# Patient Record
Sex: Female | Born: 1990 | Race: White | Hispanic: No | Marital: Single | Smoking: Former smoker
Health system: Southern US, Community
[De-identification: ages and names within clinical notes are randomized; demographics above are authoritative.]

## PROBLEM LIST (undated history)

## (undated) DIAGNOSIS — E282 Polycystic ovarian syndrome: Secondary | ICD-10-CM

## (undated) DIAGNOSIS — F32A Depression, unspecified: Secondary | ICD-10-CM

## (undated) DIAGNOSIS — T7840XA Allergy, unspecified, initial encounter: Secondary | ICD-10-CM

## (undated) DIAGNOSIS — F329 Major depressive disorder, single episode, unspecified: Secondary | ICD-10-CM

## (undated) DIAGNOSIS — F17200 Nicotine dependence, unspecified, uncomplicated: Secondary | ICD-10-CM

## (undated) HISTORY — DX: Polycystic ovarian syndrome: E28.2

## (undated) HISTORY — DX: Depression, unspecified: F32.A

## (undated) HISTORY — PX: WISDOM TOOTH EXTRACTION: SHX21

## (undated) HISTORY — DX: Allergy, unspecified, initial encounter: T78.40XA

## (undated) HISTORY — DX: Nicotine dependence, unspecified, uncomplicated: F17.200

## (undated) HISTORY — DX: Major depressive disorder, single episode, unspecified: F32.9

---
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
## 2009-07-13 ENCOUNTER — Emergency Department (HOSPITAL_COMMUNITY): Admission: EM | Admit: 2009-07-13 | Discharge: 2009-07-13 | Payer: Self-pay | Admitting: Emergency Medicine

## 2009-07-18 ENCOUNTER — Emergency Department (HOSPITAL_COMMUNITY): Admission: EM | Admit: 2009-07-18 | Discharge: 2009-07-18 | Payer: Self-pay | Admitting: Family Medicine

## 2009-08-25 ENCOUNTER — Emergency Department (HOSPITAL_COMMUNITY): Admission: EM | Admit: 2009-08-25 | Discharge: 2009-08-25 | Payer: Self-pay | Admitting: Emergency Medicine

## 2009-08-31 ENCOUNTER — Ambulatory Visit: Payer: Self-pay | Admitting: Internal Medicine

## 2009-08-31 DIAGNOSIS — S93409A Sprain of unspecified ligament of unspecified ankle, initial encounter: Secondary | ICD-10-CM | POA: Insufficient documentation

## 2010-02-05 ENCOUNTER — Emergency Department (HOSPITAL_COMMUNITY): Admission: EM | Admit: 2010-02-05 | Discharge: 2010-02-05 | Payer: Self-pay | Admitting: Emergency Medicine

## 2010-03-04 ENCOUNTER — Ambulatory Visit: Payer: Self-pay | Admitting: Family Medicine

## 2010-05-07 NOTE — Assessment & Plan Note (Signed)
Summary: SICK/JBB   Vital Signs:  Patient Profile:   20 Years Old Female CC:      Cold & URI symptoms Height:     69 inches Weight:      228 pounds BMI:     33.79 O2 Sat:      97 % O2 treatment:    Room Air Temp:     97.1 degrees F oral Pulse rate:   54 / minute Pulse rhythm:   regular Resp:     20 per minute BP sitting:   136 / 71  (right arm)  Pt. in pain?   yes    Location:   head    Intensity:   5    Type:       aching  Vitals Entered By: Levonne Spiller EMT-P (March 04, 2010 2:03 PM)              Is Patient Diabetic? No  Does patient need assistance? Functional Status Self care Ambulation Normal Comments Pt. is a smoker. Half pack per day.      Current Allergies: No known allergies History of Present Illness History from: patient Chief Complaint: Cold & URI symptoms History of Present Illness: The patient is presenting today to be evaluated for sinus pain and pressure and headache.  She is having significant postnasal drainage and congestion and low grade fever.  Pt says she has been having symptoms for at least 1.5 weeks and maybe more than 2.  She has been around sick contacts at her job and all of them have gotten better and she has continued to be sick and she is upset about that.  In addition, she says that she is starting to have sore throat. She denies nausea and vomiting and denies being pregnant.    REVIEW OF SYSTEMS Constitutional Symptoms      Denies fever, chills, night sweats, weight loss, weight gain, and fatigue.  Eyes       Denies change in vision, eye pain, eye discharge, glasses, contact lenses, and eye surgery. Ear/Nose/Throat/Mouth       Complains of ear pain, frequent runny nose, sinus problems, and sore throat.      Denies hearing loss/aids, change in hearing, ear discharge, dizziness, frequent nose bleeds, hoarseness, and tooth pain or bleeding.      Comments: Bilateral Ear Pain Respiratory       Complains of productive cough.       Denies dry cough, wheezing, shortness of breath, asthma, bronchitis, and emphysema/COPD.      Comments: Colored Sputum Cardiovascular       Denies murmurs, chest pain, and tires easily with exhertion.    Gastrointestinal       Denies stomach pain, nausea/vomiting, diarrhea, constipation, blood in bowel movements, and indigestion. Genitourniary       Denies painful urination, kidney stones, and loss of urinary control. Neurological       Denies paralysis, seizures, and fainting/blackouts. Musculoskeletal       Denies muscle pain, joint pain, joint stiffness, decreased range of motion, redness, swelling, muscle weakness, and gout.  Skin       Denies bruising, unusual mles/lumps or sores, and hair/skin or nail changes.  Psych       Denies mood changes, temper/anger issues, anxiety/stress, speech problems, depression, and sleep problems.  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 Smokes since senior year of [employer] No alcohol or drugs  Risk Factors: Caffeine Use: 3 (01/20/2007)  Risk Factors: Smoking Status: current (03/04/2010) Packs/Day: 1.0 (03/04/2010) Cans of tobacco/wk: no (01/20/2007)  Past Medical History: Depressive Disorder Allergic Rhinitis Chronic Active Nicotine Dependence  Past Surgical History: Denies surgical history  Family History: Brother and Dad have bipolar disorder depression dad has Parkinson's disease  Social History: Mom lives in [place]  - Father lives iin [place]  GED 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 Smokes since senior year of [employer] No alcohol or drugs  Packs/Day:  1.0 Physical Exam General appearance: well developed, well nourished, no acute  distress Head: normocephalic, atraumatic Eyes: conjunctivae and lids normal Pupils: equal, round, reactive to light Ears: normal, no lesions or deformities Nasal: pale, boggy, swollen nasal turbinates with thick yellow discharge seen Oral/Pharynx: tongue normal, posterior pharynx with erythema, no exudate, tonsillar enlargement and touching seen Neck: neck supple,  trachea midline, no masses Thyroid: no nodules, masses, tenderness, or enlargement Chest/Lungs: no rales, wheezes, or rhonchi bilateral, breath sounds equal without effort Heart: regular rate and  rhythm, no murmur Abdomen: soft, non-tender without obvious organomegaly Extremities: normal extremities Neurological: grossly intact and non-focal Skin: no obvious rashes or lesions MSE: oriented to time, place, and person Assessment Problems:   SPRAIN/STRAIN, ANKLE NOS (ICD-845.00) CHEST PAIN (ICD-786.50) PANIC ATTACK, ACUTE (ICD-300.01) NAUSEA (ICD-787.02) TONSILLITIS, ACUTE (ICD-463) SYNCOPE (ICD-780.2) TOBACCO ABUSE (ICD-305.1) IRREGULAR MENSES (ICD-626.4) SYNCOPE, HX OF (ICD-V12.49) DEPRESSION, MAJOR (ICD-296.20)  Assessed TOBACCO ABUSE as unchanged - Clanford Johnson MD Assessed CHEST PAIN as improved - Standley Dakins MD Assessed PANIC ATTACK, ACUTE as improved - Standley Dakins MD New Problems: ACUTE SINUSITIS, UNSPECIFIED (ICD-461.9)   Patient Education: Patient and/or caregiver instructed in the following: rest, fluids. The risks, benefits and possible side effects were clearly explained and discussed with the patient.  The patient verbalized clear understanding.  The patient was given instructions to return if symptoms don't improve, worsen or new changes develop.  If it is not during clinic hours and the patient cannot get back to this clinic then the patient was told to seek medical care at an available urgent care or emergency department.  The patient verbalized understanding.   Demonstrates willingness to  comply.  Plan New Medications/Changes: AMOXICILLIN 875 MG TABS (AMOXICILLIN) take 1 by mouth two times a day until completed  #20 x 0, 03/04/2010, Clanford Johnson MD FLUTICASONE PROPIONATE 50 MCG/ACT SUSP (FLUTICASONE PROPIONATE) 2 sprays per nostril once daily  #1 x 0, 03/04/2010, Clanford Johnson MD  New Orders: Tobacco use cessation intensive >10 minutes [99407] Planning Comments:   The patient was counseled and advised to stop using all tobacco products.  Medical assistance was offered and the patient was encouraged to call 1-800-QUIT-NOW to get a smoking cessation coach.   More than 12 mins of counseling performed.  Follow Up: Follow up in 1-2 days if no improvement, Follow up on an as needed basis, Follow up with Primary Physician  The patient and/or caregiver has been counseled thoroughly with regard to medications prescribed including dosage, schedule, interactions, rationale for use, and possible side effects and they verbalize understanding.  Diagnoses and expected course of recovery discussed and will return if not improved as expected or if the condition worsens. Patient and/or caregiver verbalized understanding.  Prescriptions: AMOXICILLIN 875 MG TABS (AMOXICILLIN) take 1 by  mouth two times a day until completed  #20 x 0   Entered and Authorized by:   Standley Dakins MD   Signed by:   Standley Dakins MD on 03/04/2010   Method used:   Electronically to        [facility] [id number] (retail)       [address]       [place], [place]  [address]       Ph: [phone] or [phone]       Fax: [phone]   RxID:   [id number] FLUTICASONE PROPIONATE 50 MCG/ACT SUSP (FLUTICASONE PROPIONATE) 2 sprays per nostril once daily  #1 x 0   Entered and Authorized by:   Standley Dakins MD   Signed by:   Standley Dakins MD on 03/04/2010   Method used:   [facility]        [facility] (retail)       [address]       [place], [place]   [address]       Ph: [phone] or [phone]       Fax: [phone]   RxID:   [id number]   Patient Instructions: 1)  Tobacco is very bad for your health and your loved ones! You Should stop smoking!. 2)  Stop Smoking Tips: Choose a Quit date. Cut down before the Quit date. decide what you will do as a substitute when you feel the urge to smoke(gum,toothpick,exercise). 3)  Oral Rehydration Solution: drink 1/2 ounce every 15 minutes. If tolerated afert 1 hour, drink 1 ounce every 15 minutes. As you can tolerate, keep adding 1/2 ounce every 15 minutes, up to a total of 2-4 ounces. Contact the office if unable to tolerate oral solution, if you keep vomiting, or you continue to have signs of dehydration. 4)  Take your antibiotic as prescribed until ALL of it is gone, but stop if you develop a rash or swelling and contact our office as soon as possible. 5)  Acute sinusitis symptoms for less than 10 days are not helped by antibiotics.Use warm moist compresses, and over the counter decongestants ( only as directed). Call if no improvement in 5-7 days, sooner if increasing pain, fever, or new symptoms. 6)  The patient was informed that there is no on-call provider or services available at this clinic during off-hours (when the clinic is closed).  If the patient developed a problem or concern that required immediate attention, the patient was advised to go the the nearest available urgent care or emergency department for medical care.  The patient verbalized understanding.   7)  The patient was counseled and advised to stop using all tobacco products.  Medical assistance was offered and the patient was encouraged to call 1-800-QUIT-NOW to get a smoking cessation coach.     Orders Added: 1)  Tobacco use cessation intensive >10 minutes [99407]    Preventive Screening-Counseling & Management  Alcohol-Tobacco     Smoking Status: current     Smoking Cessation Counseling: yes     Smoke Cessation Stage:  precontemplative     Packs/Day: 1.0     Year Started: 2008     Tobacco Counseling: to quit use of tobacco products
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
## 2010-05-12 ENCOUNTER — Inpatient Hospital Stay (INDEPENDENT_AMBULATORY_CARE_PROVIDER_SITE_OTHER)
Admission: RE | Admit: 2010-05-12 | Discharge: 2010-05-12 | Disposition: A | Discharge status: Home / Self Care | Payer: BC Managed Care – PPO | Source: Ambulatory Visit | Attending: Family Medicine | Admitting: Family Medicine

## 2010-05-12 ENCOUNTER — Ambulatory Visit (HOSPITAL_COMMUNITY)
Admission: RE | Admit: 2010-05-12 | Discharge: 2010-05-12 | Disposition: A | Discharge status: Home / Self Care | Payer: BC Managed Care – PPO | Source: Ambulatory Visit | Attending: Family Medicine | Admitting: Family Medicine

## 2010-05-12 DIAGNOSIS — R05 Cough: Secondary | ICD-10-CM | POA: Insufficient documentation

## 2010-05-12 DIAGNOSIS — R059 Cough, unspecified: Secondary | ICD-10-CM | POA: Insufficient documentation

## 2010-05-12 DIAGNOSIS — R509 Fever, unspecified: Secondary | ICD-10-CM | POA: Insufficient documentation

## 2010-05-12 DIAGNOSIS — B9789 Other viral agents as the cause of diseases classified elsewhere: Secondary | ICD-10-CM

## 2010-05-12 LAB — POCT URINALYSIS DIPSTICK
Bilirubin Urine: NEGATIVE
Nitrite: NEGATIVE
Urine Glucose, Fasting: NEGATIVE mg/dL
Urobilinogen, UA: 0.2 mg/dL (ref 0.0–1.0)
pH: 6 (ref 5.0–8.0)

## 2010-05-12 LAB — POCT I-STAT, CHEM 8
Glucose, Bld: 98 mg/dL (ref 70–99)
HCT: 45 % (ref 36.0–46.0)
Hemoglobin: 15.3 g/dL — ABNORMAL HIGH (ref 12.0–15.0)
Potassium: 3.8 mEq/L (ref 3.5–5.1)
Sodium: 137 mEq/L (ref 135–145)
TCO2: 23 mmol/L (ref 0–100)

## 2010-06-18 LAB — DIFFERENTIAL
Basophils Absolute: 0 10*3/uL (ref 0.0–0.1)
Basophils Relative: 0 % (ref 0–1)
Lymphocytes Relative: 17 % (ref 12–46)
Monocytes Absolute: 0.6 10*3/uL (ref 0.1–1.0)
Neutro Abs: 9 10*3/uL — ABNORMAL HIGH (ref 1.7–7.7)
Neutrophils Relative %: 78 % — ABNORMAL HIGH (ref 43–77)

## 2010-06-18 LAB — BASIC METABOLIC PANEL
BUN: 8 mg/dL (ref 6–23)
Calcium: 9 mg/dL (ref 8.4–10.5)
GFR calc non Af Amer: >60 mL/min (ref >60)
Glucose, Bld: 86 mg/dL (ref 70–99)
Potassium: 4.1 mEq/L (ref 3.5–5.1)

## 2010-06-18 LAB — CBC
HCT: 44.5 % (ref 36.0–46.0)
MCHC: 34.5 g/dL (ref 30.0–36.0)
Platelets: 192 10*3/uL (ref 150–400)
RDW: 12.3 % (ref 11.5–15.5)
WBC: 11.7 10*3/uL — ABNORMAL HIGH (ref 4.0–10.5)

## 2010-06-18 LAB — POCT PREGNANCY, URINE: Preg Test, Ur: NEGATIVE

## 2011-03-23 ENCOUNTER — Emergency Department (HOSPITAL_COMMUNITY): Payer: BC Managed Care – PPO

## 2011-03-23 ENCOUNTER — Emergency Department (HOSPITAL_COMMUNITY)
Admission: EM | Admit: 2011-03-23 | Discharge: 2011-03-23 | Disposition: A | Discharge status: Home / Self Care | Payer: BC Managed Care – PPO | Attending: Emergency Medicine | Admitting: Emergency Medicine

## 2011-03-23 ENCOUNTER — Encounter: Payer: Self-pay | Admitting: *Deleted

## 2011-03-23 DIAGNOSIS — S99929A Unspecified injury of unspecified foot, initial encounter: Secondary | ICD-10-CM | POA: Insufficient documentation

## 2011-03-23 DIAGNOSIS — W1809XA Striking against other object with subsequent fall, initial encounter: Secondary | ICD-10-CM | POA: Insufficient documentation

## 2011-03-23 DIAGNOSIS — S8990XA Unspecified injury of unspecified lower leg, initial encounter: Secondary | ICD-10-CM | POA: Insufficient documentation

## 2011-03-23 DIAGNOSIS — S90819A Abrasion, unspecified foot, initial encounter: Secondary | ICD-10-CM

## 2011-03-23 DIAGNOSIS — IMO0002 Reserved for concepts with insufficient information to code with codable children: Secondary | ICD-10-CM | POA: Insufficient documentation

## 2011-03-23 MED ORDER — HYDROCODONE-ACETAMINOPHEN 5-325 MG PO TABS: 2.0000 | ORAL_TABLET | ORAL | Status: AC | PRN

## 2011-03-23 NOTE — ED Notes (Signed)
Patient transported to X-ray 

## 2011-03-23 NOTE — ED Provider Notes (Signed)
History     CSN: [id number] Arrival date & time: 03/23/2011 11:58 AM   First MD Initiated Contact with Patient 03/23/11 1247      Chief Complaint  Patient presents with  . Toe Injury    (Consider location/radiation/quality/duration/timing/severity/associated sxs/prior treatment) HPI Reports falling and injuring right toes on concrete, abrasions noted to 2nd and 3rd toes. Minimal bleeding noted. Ambulatory at triage.  History reviewed. No pertinent past medical history.  History reviewed. No pertinent past surgical history.  History reviewed. No pertinent family history.  History  Substance Use Topics  . Smoking status: Never Smoker   . Smokeless tobacco: Not on file  . Alcohol Use: No    OB History    Grav Para Term Preterm Abortions TAB SAB Ect Mult Living                  Review of Systems  All other systems reviewed and are negative.    Allergies  Review of patient's allergies indicates no known allergies.  Home Medications  No current outpatient prescriptions on file.  BP 122/93  Pulse 91  Temp(Src) 98 F (36.7 C) (Oral)  Resp 18  SpO2 97%  LMP 02/23/2011  Physical Exam  Nursing note and vitals reviewed. Constitutional: She is oriented to person, place, and time. She appears well-developed and well-nourished. No distress.  HENT:  Head: Normocephalic and atraumatic.  Eyes: Pupils are equal, round, and reactive to light.  Neck: Normal range of motion.  Cardiovascular: Normal rate and intact distal pulses.   Pulmonary/Chest: No respiratory distress.  Abdominal: Normal appearance. She exhibits no distension.  Musculoskeletal: Normal range of motion.       Feet:  Neurological: She is alert and oriented to person, place, and time. No cranial nerve deficit.  Skin: Skin is warm and dry. No rash noted.  Psychiatric: She has a normal mood and affect. Her behavior is normal.    ED Course  Procedures (including critical care time)  Labs Reviewed -  No data to display Dg Foot Complete Right  03/23/2011  *RADIOLOGY REPORT*  Clinical Data: Trauma.  The patient tripped on steps.  Abrasions of the top of the toes.  RIGHT FOOT COMPLETE - 3+ VIEW  Comparison: Right ankle 08/25/2009  Findings: There is no evidence for acute fracture or dislocation. No soft tissue foreign body or gas identified.  IMPRESSION: Negative  Original Report Authenticated By: Patterson Hammersmith, M.D.     1. Abrasion of foot or toe       MDM          Nelia Shi, MD 03/23/11 1325

## 2011-03-23 NOTE — ED Notes (Signed)
Patient states that she struck her toes on concrete steps and fell.  Pt has abrasions noted to her second, third and fourth toe of her right foot.  Bleeding controlled.  Pt has swelling to same.

## 2011-03-23 NOTE — ED Notes (Signed)
Reports falling and injuring right toes on concrete, abrasions noted to 2nd and 3rd toes. Minimal bleeding noted. Ambulatory at triage.

## 2011-06-25 ENCOUNTER — Ambulatory Visit (INDEPENDENT_AMBULATORY_CARE_PROVIDER_SITE_OTHER): Payer: BC Managed Care – PPO | Admitting: Family Medicine

## 2011-06-25 ENCOUNTER — Encounter: Payer: Self-pay | Admitting: Family Medicine

## 2011-06-25 VITALS — BP 120/78 | HR 107 | Temp 98.4°F | Ht 68.0 in | Wt 249.1 lb

## 2011-06-25 DIAGNOSIS — N926 Irregular menstruation, unspecified: Secondary | ICD-10-CM

## 2011-06-25 DIAGNOSIS — N912 Amenorrhea, unspecified: Secondary | ICD-10-CM

## 2011-06-25 DIAGNOSIS — M545 Low back pain: Secondary | ICD-10-CM

## 2011-06-25 LAB — POCT URINE PREGNANCY: Preg Test, Ur: NEGATIVE

## 2011-06-25 MED ORDER — TIZANIDINE HCL 4 MG PO TABS: 4.0000 mg | ORAL_TABLET | Freq: Every evening | ORAL | Status: DC

## 2011-06-25 MED ORDER — DICLOFENAC SODIUM 75 MG PO TBEC: 75.0000 mg | DELAYED_RELEASE_TABLET | Freq: Two times a day (BID) | ORAL | Status: DC

## 2011-06-25 MED ORDER — HYDROCODONE-ACETAMINOPHEN 5-500 MG PO TABS: 1.0000 | ORAL_TABLET | Freq: Four times a day (QID) | ORAL | Status: DC | PRN

## 2011-06-25 MED ORDER — OXYCODONE-ACETAMINOPHEN 5-325 MG PO TABS: 1.0000 | ORAL_TABLET | ORAL | Status: AC | PRN
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
## 2011-06-26 LAB — HCG, SERUM, QUALITATIVE: Preg, Serum: NEGATIVE

## 2011-06-27 ENCOUNTER — Telehealth: Payer: Self-pay | Admitting: Internal Medicine

## 2011-06-27 NOTE — Telephone Encounter (Signed)
Pt was seen 06/25/11 and she is saying the Muscle Relaxer Prescribed for her is giving her extreme dry mouth to where she can't sleep for waking up and having to chug water. She is wondering if anything else can be prescribed in place of that muscle relaxer.

## 2011-06-29 NOTE — Telephone Encounter (Signed)
D/c the zanaflex that i gave her  In stead: Flexeril 10 mg, 1/2 to 1 tab po qhs, 30, 0 refills

## 2011-06-30 MED ORDER — CYCLOBENZAPRINE HCL 10 MG PO TABS: 10.0000 mg | ORAL_TABLET | Freq: Every evening | ORAL | Status: AC | PRN

## 2011-06-30 NOTE — Telephone Encounter (Signed)
Patient advised and rx sent to the pharmacy  

## 2012-01-16 ENCOUNTER — Encounter (HOSPITAL_COMMUNITY): Payer: Self-pay | Admitting: Emergency Medicine

## 2012-01-16 ENCOUNTER — Emergency Department (INDEPENDENT_AMBULATORY_CARE_PROVIDER_SITE_OTHER)
Admission: EM | Admit: 2012-01-16 | Discharge: 2012-01-16 | Disposition: A | Discharge status: Home / Self Care | Payer: BC Managed Care – PPO | Source: Home / Self Care

## 2012-01-16 ENCOUNTER — Ambulatory Visit (HOSPITAL_COMMUNITY)
Admission: EM | Admit: 2012-01-16 | Discharge: 2012-01-16 | Disposition: A | Discharge status: Home / Self Care | Payer: BC Managed Care – PPO | Attending: Family Medicine | Admitting: Family Medicine

## 2012-01-16 DIAGNOSIS — S93409A Sprain of unspecified ligament of unspecified ankle, initial encounter: Secondary | ICD-10-CM

## 2012-01-16 DIAGNOSIS — X500XXA Overexertion from strenuous movement or load, initial encounter: Secondary | ICD-10-CM | POA: Insufficient documentation

## 2012-01-16 DIAGNOSIS — M25579 Pain in unspecified ankle and joints of unspecified foot: Secondary | ICD-10-CM | POA: Insufficient documentation

## 2012-01-16 NOTE — ED Provider Notes (Signed)
History     CSN: [id number]  Arrival date & time 01/16/12  1140   None     Chief Complaint  Patient presents with  . Foot Injury    (Consider location/radiation/quality/duration/timing/severity/associated sxs/prior treatment) HPI Comments: This 21 year old female stepped off a curb and twisted her left ankle this morning around 10 AM. She is complaining of pain over the lateral malleolus. Denies foot pain or tenderness, denies pain over the medial malleolus.  Patient is a 21 y.o. female presenting with foot injury.  Foot Injury     Past Medical History  Diagnosis Date  . Depression   . Allergy   . Nicotine addiction     History reviewed. No pertinent past surgical history.  Family History  Problem Relation Age of Onset  . Mental illness Father   . Parkinsonism Father   . Mental illness Brother     History  Substance Use Topics  . Smoking status: Former Games developer  . Smokeless tobacco: Not on file  . Alcohol Use: Yes    OB History    Grav Para Term Preterm Abortions TAB SAB Ect Mult Living                  Review of Systems  Constitutional: Negative for fever, chills and activity change.  HENT: Negative.   Respiratory: Negative.   Cardiovascular: Negative.   Musculoskeletal:       As per HPI  Skin: Negative for color change, pallor and rash.  Neurological: Negative.     Allergies  Review of patient's allergies indicates no known allergies.  Home Medications   Current Outpatient Rx  Name Route Sig Dispense Refill  . DICLOFENAC SODIUM 75 MG PO TBEC Oral Take 1 tablet (75 mg total) by mouth 2 (two) times daily. 60 tablet 3    BP 119/78  Pulse 70  Temp 98.8 F (37.1 C) (Oral)  Resp 18  SpO2 100%  LMP 01/11/2012  Physical Exam  Constitutional: She is oriented to person, place, and time. She appears well-developed and well-nourished. No distress.  HENT:  Head: Normocephalic and atraumatic.  Eyes: EOM are normal. Pupils are equal, round, and  reactive to light.  Neck: Normal range of motion. Neck supple.  Musculoskeletal: She exhibits edema and tenderness.       Swelling and tenderness over the lateral malleolus of the left ankle. No deformity, no swelling or tenderness of the foot. Pedal pulses 2+, neurovascular and motor sensory intact.  Lymphadenopathy:    She has no cervical adenopathy.  Neurological: She is alert and oriented to person, place, and time. No cranial nerve deficit.  Skin: Skin is warm and dry.  Psychiatric: She has a normal mood and affect.    ED Course  Procedures (including critical care time)  Labs Reviewed - No data to display Dg Ankle Complete Left  01/16/2012  *RADIOLOGY REPORT*  Clinical Data: Pain lateral malleolus after twisting.  LEFT ANKLE COMPLETE - 3+ VIEW  Comparison: None.  Findings: Soft tissue swelling lateral malleolar region.  No underlying fracture or dislocation.  IMPRESSION: Soft tissue swelling lateral malleolar region.  No underlying fracture or dislocation.   Original Report Authenticated By: Fuller Canada, M.D.      1. Ankle sprain       MDM  Rest, ice, elevation, wear ASO. She states she is unable to use crutches because she does not have the upper body strength. She is requesting a form of immobilization such as a boot  and she can wear without having to use crutches. He was advised that she not bear weight for the first 2 or 3 days minimal.         Hayden Rasmussen, NP 01/16/12 1403

## 2012-01-16 NOTE — ED Notes (Signed)
Pt is c/o left foot inj since 10:00am... Says she stepped of sidewalk when she twisted her left ankle... Says she heard a "popping" sound.... Sx include: tender, swelling, unable to put pressure... Pt is alert w/no signs of distress.

## 2012-01-17 NOTE — ED Provider Notes (Signed)
Medical screening examination/treatment/procedure(s) were performed by resident physician or non-physician practitioner and as supervising physician I was immediately available for consultation/collaboration.   KINDL,JAMES DOUGLAS MD.    James D Kindl, MD 01/17/12 1733 

## 2012-02-02 IMAGING — CR DG HAND COMPLETE 3+V*R*
3 series · 3 of 3 positions shown · non-contrast
Comparison: None.

CLINICAL DATA: Fell, smashing injury, pain

RIGHT HAND - COMPLETE 3+ VIEW

[view not recorded (1 of 3)]
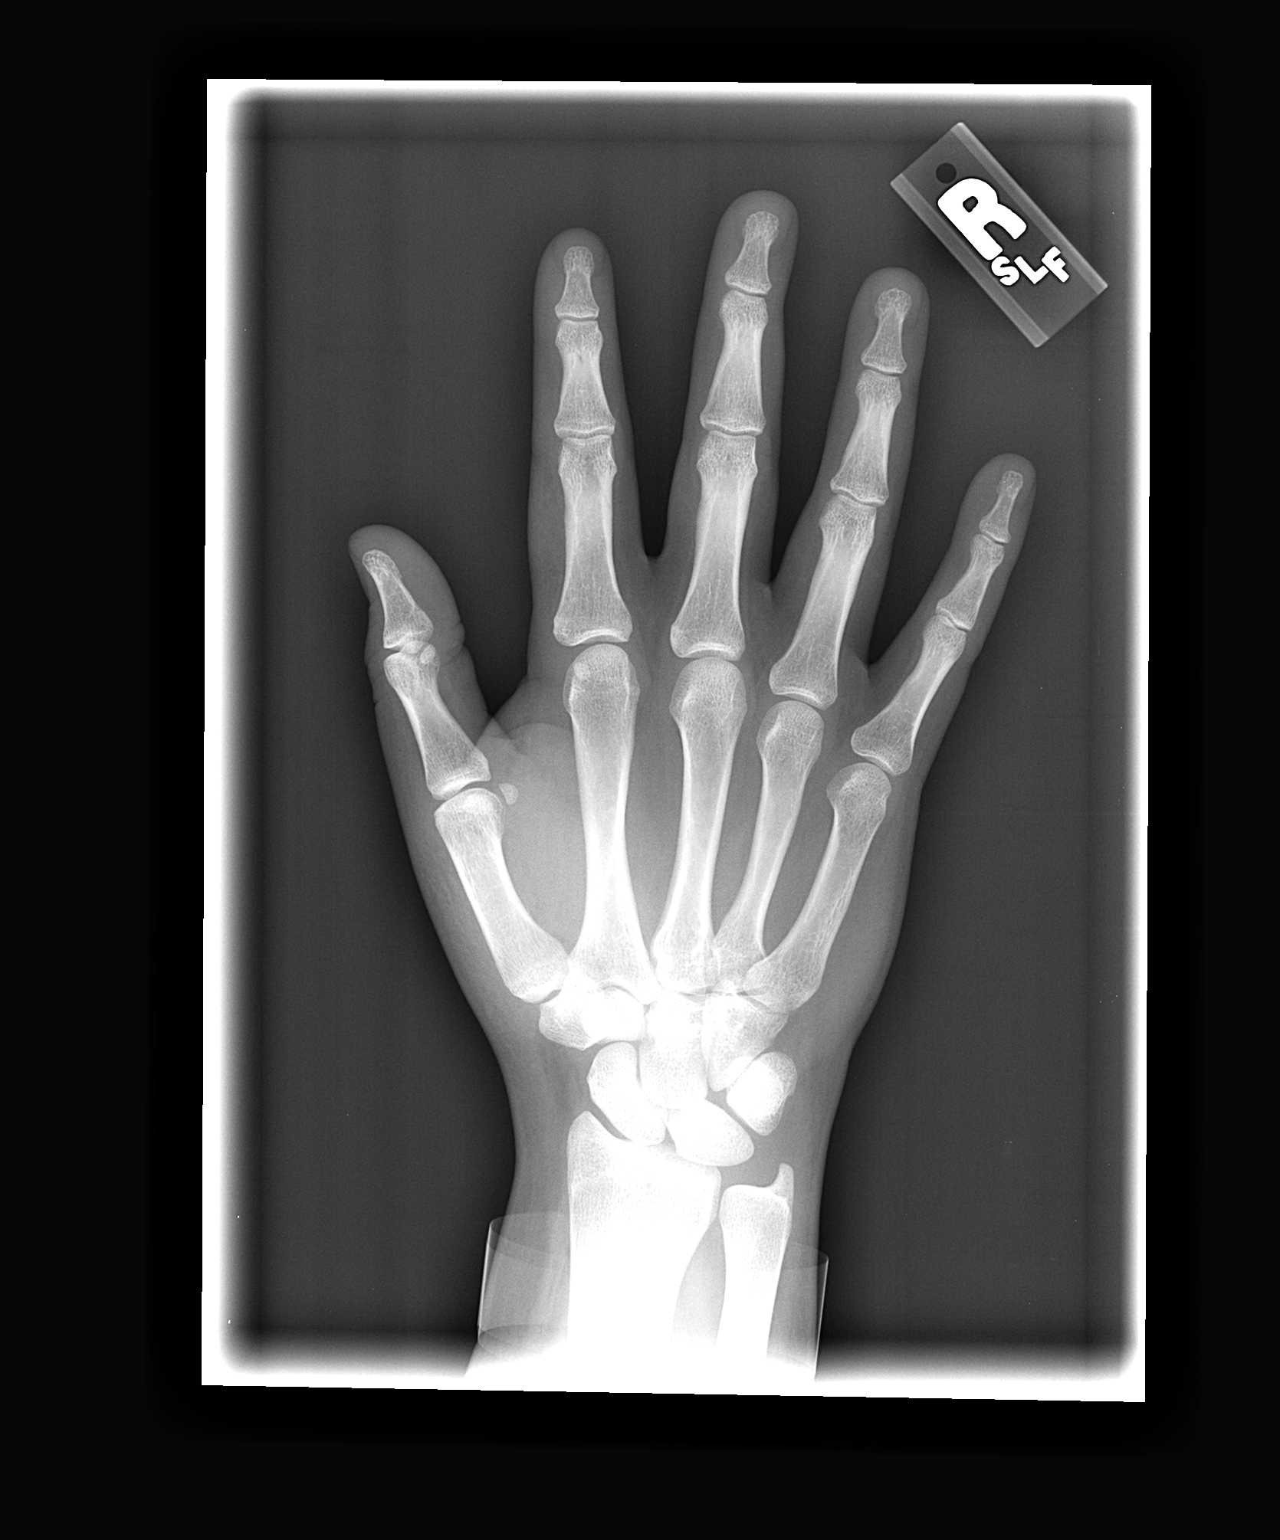

[view not recorded (2 of 3)]
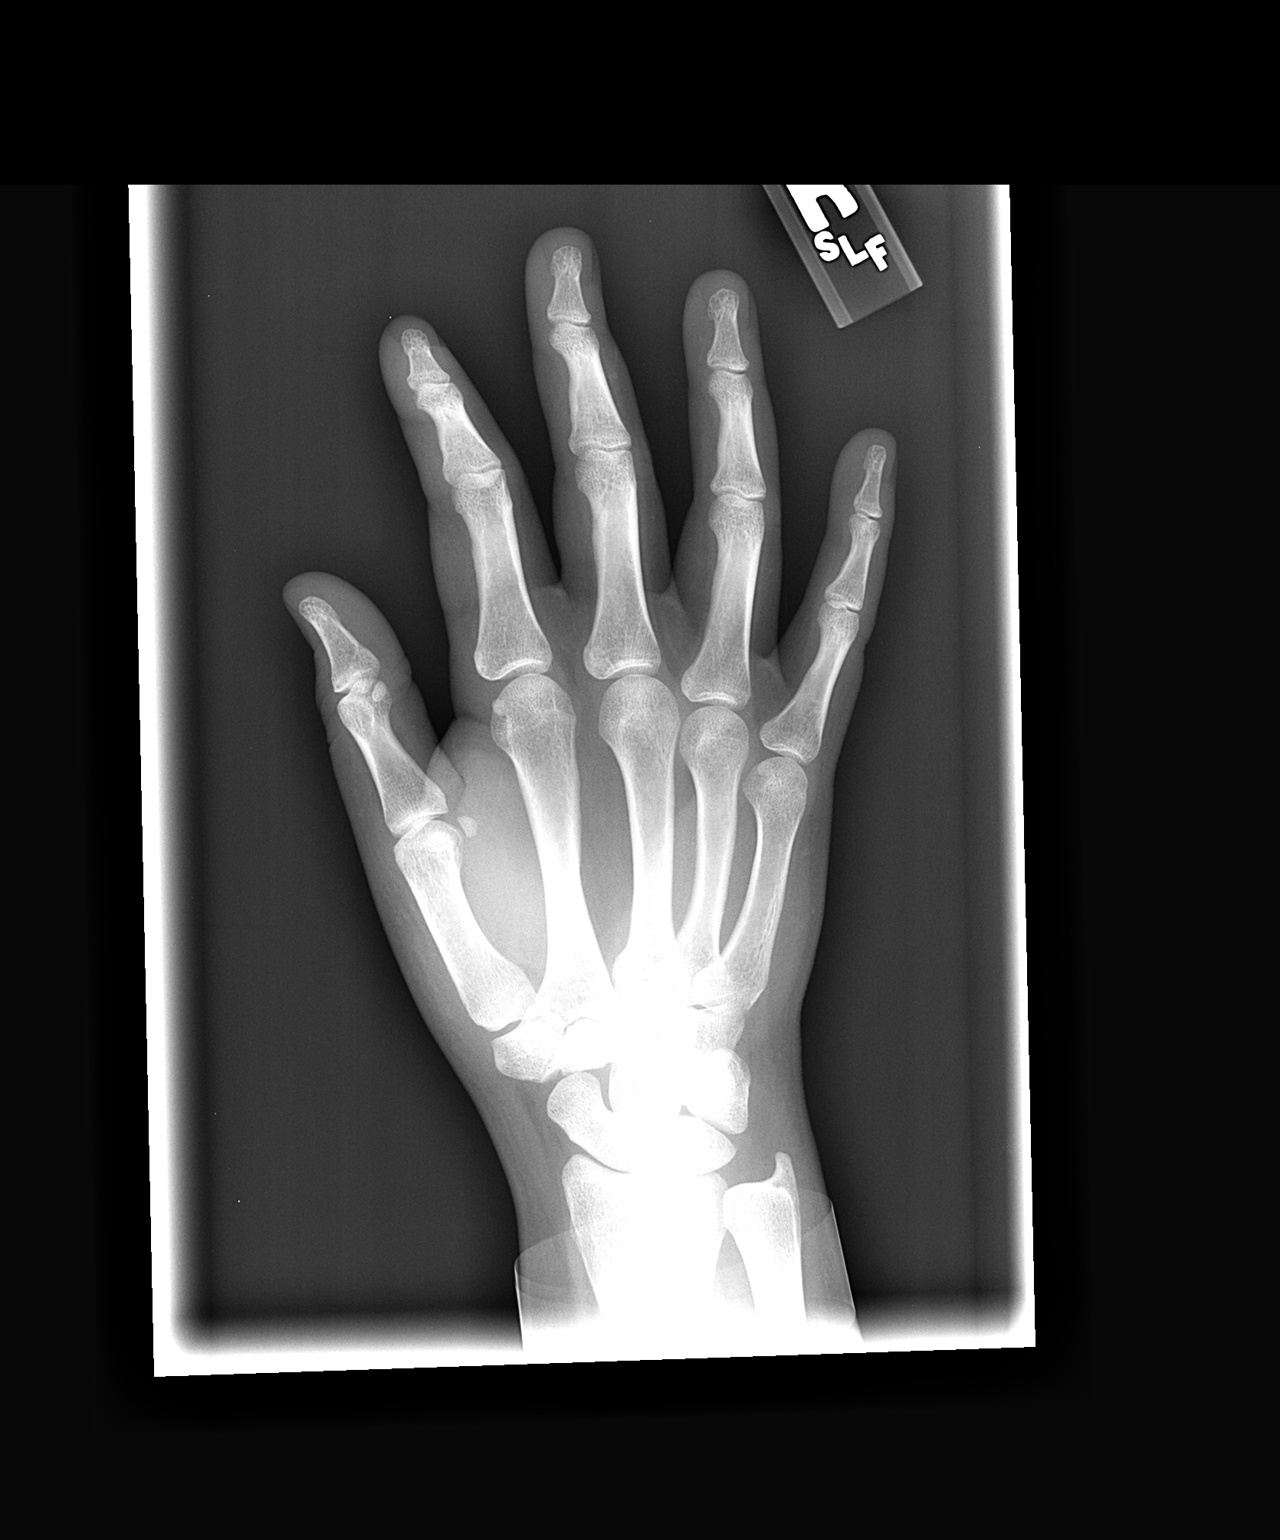

[view not recorded (3 of 3)]
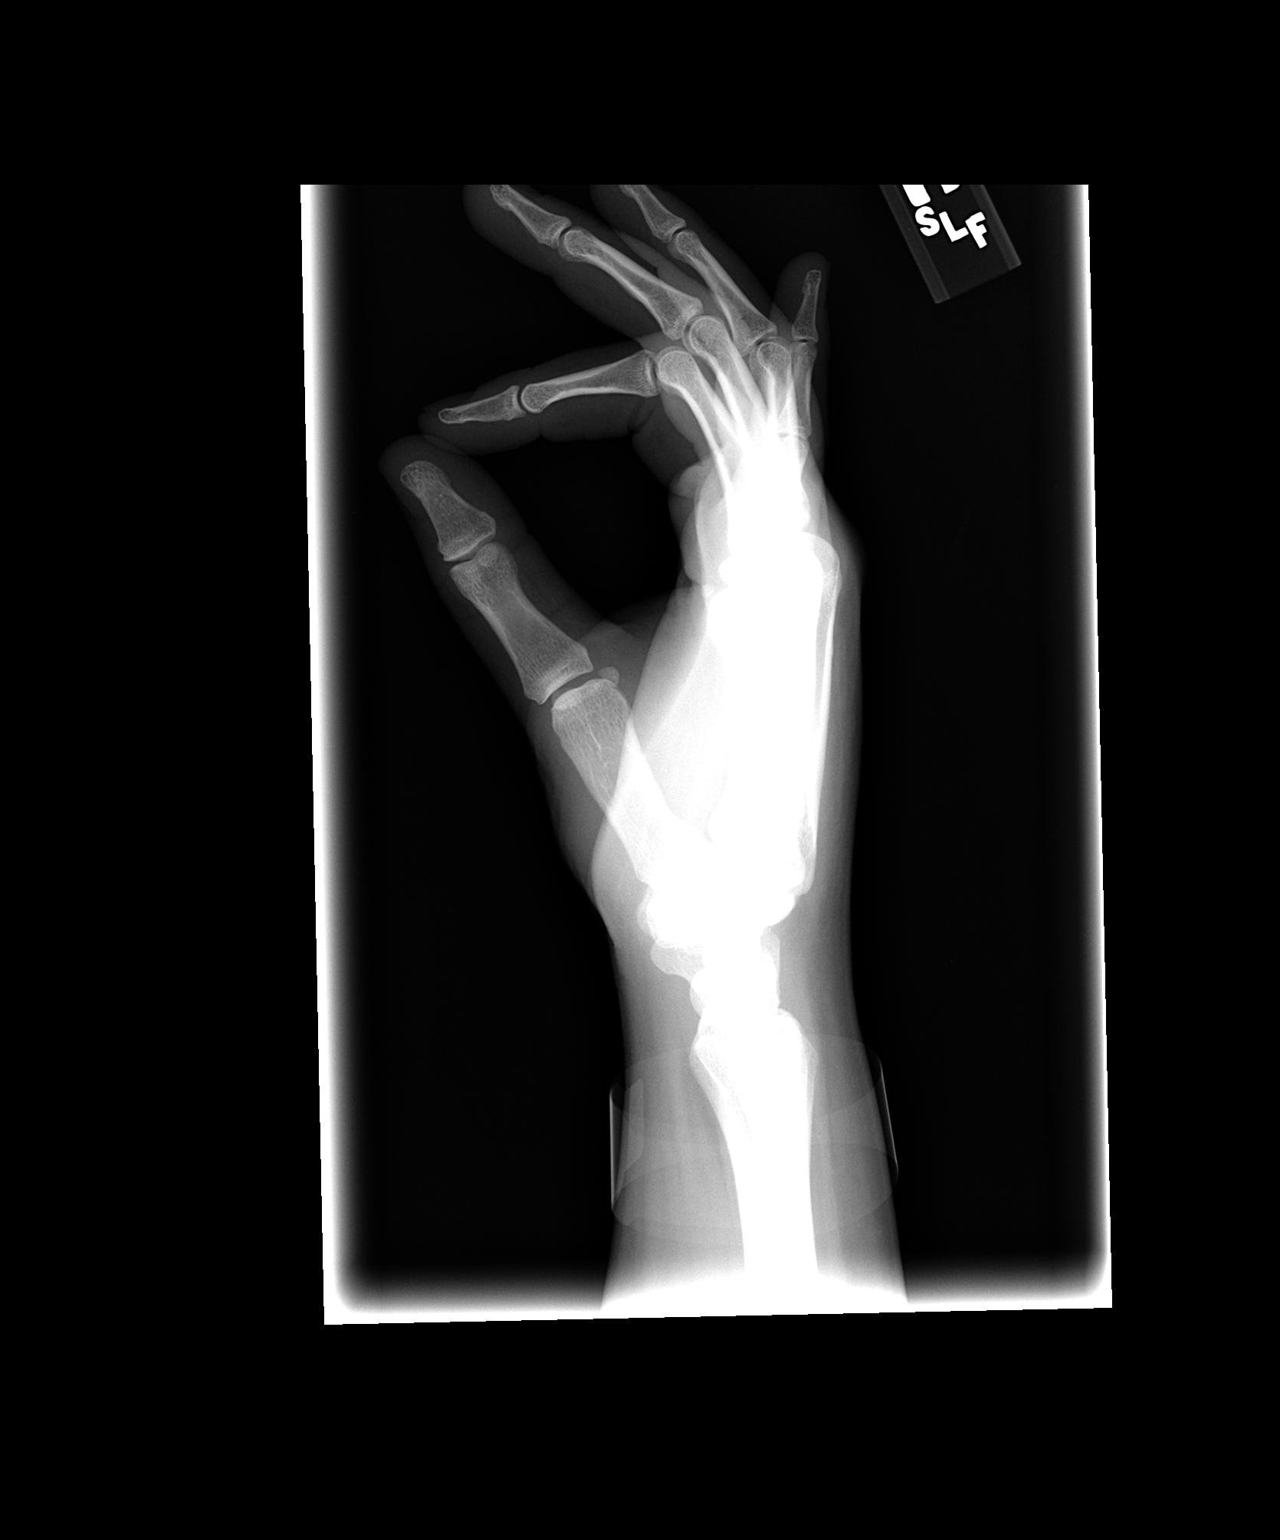

[3 of 3 positions shown; findings below may reference images not displayed]

FINDINGS: There is no evidence of fracture or dislocation.  There
is no evidence of arthropathy or other focal bony abnormality.
Soft tissues are unremarkable except for mild swelling over the
first and second metacarpals.
IMPRESSION: No visible bony injury.

## 2012-04-06 ENCOUNTER — Emergency Department: Payer: Self-pay | Admitting: Emergency Medicine

## 2012-04-13 ENCOUNTER — Ambulatory Visit (INDEPENDENT_AMBULATORY_CARE_PROVIDER_SITE_OTHER): Payer: BC Managed Care – PPO | Admitting: Family Medicine

## 2012-04-13 ENCOUNTER — Encounter: Payer: Self-pay | Admitting: Family Medicine

## 2012-04-13 VITALS — BP 102/70 | HR 110 | Temp 98.3°F | Ht 68.0 in | Wt 219.8 lb

## 2012-04-13 DIAGNOSIS — J069 Acute upper respiratory infection, unspecified: Secondary | ICD-10-CM

## 2012-04-13 DIAGNOSIS — H6691 Otitis media, unspecified, right ear: Secondary | ICD-10-CM

## 2012-04-13 DIAGNOSIS — H6690 Otitis media, unspecified, unspecified ear: Secondary | ICD-10-CM

## 2012-04-13 MED ORDER — AMOXICILLIN-POT CLAVULANATE 875-125 MG PO TABS: 1.0000 | ORAL_TABLET | Freq: Two times a day (BID) | ORAL | Status: DC

## 2012-04-13 MED ORDER — BENZONATATE 200 MG PO CAPS: 200.0000 mg | ORAL_CAPSULE | Freq: Three times a day (TID) | ORAL | Status: DC | PRN

## 2012-04-13 NOTE — Assessment & Plan Note (Signed)
Ongoing with residual non prod cough Will try tessalon tid 200 mg and can also try mucinex DM at night  Adv voice rest for hoarseness Has superimposed OM on R - will tx with augmentin Disc symptomatic care - see instructions on AVS  Update if not starting to improve in a week or if worsening

## 2012-04-13 NOTE — Patient Instructions (Addendum)
You have a viral upper respiratory infection complicated by an ear infection Take the augmentin for ear infection as directed : warning - this may decrease effectiveness of your birth control so use extra protection  Try the tessalon for cough  Also at night you can try some mucinex DM over the counter  Fluids / rest

## 2012-04-13 NOTE — Progress Notes (Signed)
Subjective:    Patient ID: Gabriella Dennis, female    DOB: 1990/12/03, 22 y.o.   MRN: [id number]  HPI Has been sick with uri symptoms  Cough and ST and hoarseness and ear pain  ? 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 DEPRESSION, MAJOR  . PANIC ATTACK, ACUTE  . TOBACCO ABUSE  . IRREGULAR MENSES  . CHEST PAIN  . SPRAIN/STRAIN, ANKLE NOS  . SYNCOPE, HX OF   Past Medical History  Diagnosis Date  . Depression   . Allergy   . Nicotine addiction    No past surgical history on file. History  Substance Use Topics  . Smoking status: Former Games developer  . Smokeless tobacco: Not on file  . Alcohol Use: Yes     Comment: occasional   Family History  Problem Relation Age of Onset  . Mental illness Father   . Parkinsonism Father   . Mental illness Brother    No Known Allergies Current Outpatient Prescriptions on File Prior to Visit  Medication Sig Dispense Refill  . FLUoxetine (PROZAC) 20 MG capsule Take 20 mg by mouth daily.      . norethindrone-ethinyl estradiol-iron (ESTROSTEP FE,TILIA FE,TRI-LEGEST FE) 1-20/1-30/1-35 MG-MCG tablet Take 1 tablet by mouth daily.           Review of Systems Review of Systems  Constitutional: Negative for fever, appetite change,  and unexpected weight change. pos for fatigue ENt pos for congestion and ear pain and st and hoarseness Eyes: Negative for pain and visual disturbance.  Respiratory: Negative for wheeze and  shortness of breath.   Cardiovascular: Negative for cp or palpitations    Gastrointestinal: Negative for nausea, diarrhea and constipation.  Genitourinary: Negative for urgency and frequency.  Skin: Negative for pallor or rash   Neurological: Negative for weakness, light-headedness, numbness and headaches.  Hematological: Negative for adenopathy. Does not bruise/bleed easily.  Psychiatric/Behavioral: Negative for dysphoric mood. The patient is not nervous/anxious.         Objective:   Physical Exam  Constitutional: She appears well-developed and well-nourished. No distress.  HENT:  Head: Normocephalic and atraumatic.  Left Ear: External ear normal.  Mouth/Throat: Oropharynx is clear and moist. No oropharyngeal exudate.       Nares are injected and congested  Mild maxillary sinus tenderness Clear post nasal drip R TM is erythematous and bulging with eff/ also scarring noted No perf L TM is dull  Eyes: Conjunctivae normal and EOM are normal. Pupils are equal, round, and reactive to light. Right eye exhibits no discharge. Left eye exhibits no discharge.  Neck: Normal range of motion. Neck supple.  Cardiovascular: Regular rhythm and normal heart sounds.  Exam reveals no gallop.        Mildly tachycardic with rate  of 93 at rest  Pulmonary/Chest: Effort normal and breath sounds normal. No respiratory distress. She has no wheezes. She has no rales.       Good air exch  Musculoskeletal: She exhibits no edema.  Lymphadenopathy:    She has no cervical adenopathy.  Neurological: She is alert. She has normal reflexes.  Skin: Skin is warm and dry. No rash noted. No erythema. No pallor.  Psychiatric: She has a normal mood and affect.          Assessment & Plan:

## 2012-04-13 NOTE — Assessment & Plan Note (Signed)
With uri- for several weeks Pt has hx of frequent stubborn R OM in the past  tx  With augmentin Disc symptomatic care - see instructions on AVS  Update if not starting to improve in a week or if worsening

## 2012-04-16 ENCOUNTER — Telehealth: Payer: Self-pay

## 2012-04-16 MED ORDER — GUAIFENESIN-CODEINE 100-10 MG/5ML PO SYRP: 5.0000 mL | ORAL_SOLUTION | Freq: Four times a day (QID) | ORAL | Status: DC | PRN

## 2012-04-16 NOTE — Telephone Encounter (Signed)
Pt advised.  Rx phoned to pharmacy.

## 2012-04-16 NOTE — Telephone Encounter (Signed)
We can try some robitussin with codeine for cough but warn that this can make her sleepy  augmentin  (any abx) can cause diarrhea- hang in there and let me know if this is unbearable or does not improve when she finishes it  Px written for call in

## 2012-04-16 NOTE — Telephone Encounter (Signed)
Pt seen on 04/13/12 and pt has been taking amoxicillin and tessalon for cough; diarrhea started on 04/13/12 and pt had diarrhea x 4 today; after eats anything has watery BM. Pt's cough is no better; S/T and ear feels better. No fever. Pt wants to know if diarrhea a side effect of either amoxicillin or tessalon. [facility]. Pt request another cough med called in.Please advise.

## 2012-04-23 ENCOUNTER — Encounter: Payer: Self-pay | Admitting: Internal Medicine

## 2012-04-23 ENCOUNTER — Ambulatory Visit (INDEPENDENT_AMBULATORY_CARE_PROVIDER_SITE_OTHER): Payer: BC Managed Care – PPO | Admitting: Internal Medicine

## 2012-04-23 VITALS — BP 120/72 | HR 59 | Temp 97.8°F | Ht 68.0 in | Wt 216.0 lb

## 2012-04-23 DIAGNOSIS — J019 Acute sinusitis, unspecified: Secondary | ICD-10-CM

## 2012-04-23 MED ORDER — HYDROCODONE-ACETAMINOPHEN 5-325 MG PO TABS: 1.0000 | ORAL_TABLET | Freq: Every evening | ORAL | Status: DC | PRN

## 2012-04-23 MED ORDER — LEVOFLOXACIN 500 MG PO TABS: 500.0000 mg | ORAL_TABLET | Freq: Every day | ORAL | Status: DC

## 2012-04-23 NOTE — Progress Notes (Signed)
  Subjective:    Patient ID: Gabriella Dennis, female    DOB: 1990-09-23, 22 y.o.   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 This is from her nose also  Still on fluoxetine for depression Sees psychiatrist in [place] (Dr Evelene Croon)  Current Outpatient Prescriptions on File Prior to Visit  Medication Sig Dispense Refill  . ALPRAZolam (XANAX) 1 MG tablet Take 1 mg by mouth as needed.      . benzonatate (TESSALON) 200 MG capsule Take 1 capsule (200 mg total) by mouth 3 (three) times daily as needed for cough.  30 capsule  0  . FLUoxetine (PROZAC) 20 MG capsule Take 20 mg by mouth daily.      . norethindrone-ethinyl estradiol-iron (ESTROSTEP FE,TILIA FE,TRI-LEGEST FE) 1-20/1-30/1-35 MG-MCG tablet Take 1 tablet by mouth daily.      Marland Kitchen guaiFENesin-codeine (ROBITUSSIN AC) 100-10 MG/5ML syrup Take 5 mLs by mouth 4 (four) times daily as needed for cough.  120 mL  0    No Known Allergies  Past Medical History  Diagnosis Date  . Depression   . Allergy   . Nicotine addiction     No past surgical history on file.  Family History  Problem Relation Age of Onset  . Mental illness Father   . Parkinsonism Father   . Mental illness Brother     History   Social History  . Marital Status: Single    Spouse Name: N/A    Number of Children: N/A  . Years of Education: N/A   Occupational History  . Not on file.   Social History Main Topics  . Smoking status: Former Games developer  . Smokeless tobacco: Not on  file  . Alcohol Use: Yes     Comment: occasional  . Drug Use: No  . Sexually Active: Yes    Birth Control/ Protection: Pill   Other Topics Concern  . Not on file   Social History Narrative  . No narrative on file   Review of Systems Sees gyn for OCP and regular check ups No vomiting Diarrhea on the augmentin--just coming back to normal No sig wheezing    Objective:   Physical Exam  Constitutional: She appears well-developed and well-nourished. No distress.  HENT:  Mouth/Throat: Oropharynx is clear and moist. No oropharyngeal exudate.       Moderate nasal congestion with thick mucus Left TM still mildly bulging but not inflamed. Right TM normal Mild maxillary tenderness  Neck: Normal range of motion. Neck supple.  Pulmonary/Chest: Effort normal and breath sounds normal. No respiratory distress. She has no wheezes. She has no rales.  Lymphadenopathy:    She has no cervical adenopathy.          Assessment & Plan:

## 2012-04-23 NOTE — Assessment & Plan Note (Signed)
Ongoing symptoms even 3 weeks better Mild bullous changes in right TM Partial response to z-pak  Need to consider M pneumoniae Discussed possible 6 week time course Will Rx levaquin Hydrocodone tabs for cough

## 2013-01-19 ENCOUNTER — Ambulatory Visit (INDEPENDENT_AMBULATORY_CARE_PROVIDER_SITE_OTHER): Payer: BC Managed Care – PPO | Admitting: Family Medicine

## 2013-01-19 ENCOUNTER — Encounter: Payer: Self-pay | Admitting: Family Medicine

## 2013-01-19 VITALS — BP 100/74 | HR 68 | Temp 97.8°F | Ht 68.0 in | Wt 232.5 lb

## 2013-01-19 DIAGNOSIS — H9209 Otalgia, unspecified ear: Secondary | ICD-10-CM

## 2013-01-20 NOTE — Progress Notes (Signed)
   Date:  01/19/2013   Name:  Gabriella Dennis   DOB:  1990/05/01   MRN:  [id number] Gender: female Age: 22 y.o.  Primary Physician:  Tillman Abide, MD   Chief Complaint: Q-tip Stuck in Left Ear   History of Present Illness:  Gabriella Dennis is a 22 y.o. very pleasant female patient who presents with the following:  Pt concerned about q-tip in l ear. Some discomfort.  Past Medical History, Surgical History, Social History, Family History, Problem List, Medications, and Allergies have been reviewed and updated if relevant.  Current Outpatient Prescriptions on File Prior to Visit  Medication Sig Dispense Refill  . ALPRAZolam (XANAX) 1 MG tablet Take 1 mg by mouth as needed.      . norethindrone-ethinyl estradiol-iron (ESTROSTEP FE,TILIA FE,TRI-LEGEST FE) 1-20/1-30/1-35 MG-MCG tablet Take 1 tablet by mouth daily.      Marland Kitchen FLUoxetine (PROZAC) 20 MG capsule Take 20 mg by mouth daily.      Marland Kitchen HYDROcodone-acetaminophen (NORCO/VICODIN) 5-325 MG per tablet Take 1 tablet by mouth at bedtime as needed.  20 tablet  0   No current facility-administered medications on file prior to visit.    Review of Systems:  GEN: No acute illnesses, no fevers, chills. GI: No n/v/d, eating normally Pulm: No SOB Interactive and getting along well at home.  Otherwise, ROS is as per the HPI.   Physical Examination: BP 100/74  Pulse 68  Temp(Src) 97.8 F (36.6 C) (Oral)  Ht 5\' 8"  (1.727 m)  Wt 232 lb 8 oz (105.461 kg)  BMI 35.36 kg/m2  LMP 01/11/2013   GEN: WDWN, NAD, Non-toxic, Alert & Oriented x 3 HEENT: Atraumatic, Normocephalic. Ear canals clean, no foreign body Ears and Nose: No external deformity. EXTR: No clubbing/cyanosis/edema NEURO: Normal gait.  PSYCH: Normally interactive. Conversant. Not depressed or anxious appearing.  Calm demeanor.    Assessment and Plan: Ear pain, unspecified laterality   Reassured, no qtip in ear.  Signed,  Elpidio Galea. Tu Bayle, MD, CAQ Sports  Medicine  [facility] [address] [place] [address] Phone: [phone] Fax: [phone]

## 2013-03-30 ENCOUNTER — Ambulatory Visit (INDEPENDENT_AMBULATORY_CARE_PROVIDER_SITE_OTHER): Payer: BC Managed Care – PPO | Admitting: Internal Medicine

## 2013-03-30 ENCOUNTER — Encounter: Payer: Self-pay | Admitting: Internal Medicine

## 2013-03-30 VITALS — BP 108/72 | HR 56 | Temp 98.4°F | Wt 228.5 lb

## 2013-03-30 DIAGNOSIS — H1033 Unspecified acute conjunctivitis, bilateral: Secondary | ICD-10-CM

## 2013-03-30 DIAGNOSIS — H1030 Unspecified acute conjunctivitis, unspecified eye: Secondary | ICD-10-CM

## 2013-03-30 MED ORDER — NEOMYCIN-POLYMYXIN-HC 3.5-10000-1 OP SUSP: 2.0000 [drp] | OPHTHALMIC | Status: DC

## 2013-03-30 NOTE — Assessment & Plan Note (Signed)
Probably bacterial but could be irritant Will change to cortisone containing drops If not better in 2 days-- to opto for thorough exam

## 2013-03-30 NOTE — Progress Notes (Signed)
Pre-visit discussion using our clinic review tool. No additional management support is needed unless otherwise documented below in the visit note.  

## 2013-03-30 NOTE — Progress Notes (Signed)
   Subjective:    Patient ID: Gabriella Dennis, female    DOB: 12-Sep-1990, 22 y.o.   MRN: [id number]  HPI Has had red eyes for about a week First, she started after using a face wash--got dry areas under eyes-- a couple of weeks ago Stopped it Then restarted--then the redness came on  [facility] Given polymixin/trimethoprim drops--not helped  Has tried warm and cool compresses Hasn't helped Still with discharge and stuck together  Current Outpatient Prescriptions on File Prior to Visit  Medication Sig Dispense Refill  . ALPRAZolam (XANAX) 1 MG tablet Take 1 mg by mouth as needed.       No current facility-administered medications on file prior to visit.    No Known Allergies  Past Medical History  Diagnosis Date  . Depression   . Allergy   . Nicotine addiction     No past surgical history on file.  Family History  Problem Relation Age of Onset  . Mental illness Father   . Parkinsonism Father   . Mental illness Brother     History   Social History  . Marital Status: Single    Spouse Name: N/A    Number of Children: N/A  . Years of Education: N/A   Occupational History  . Not on file.   Social History Main Topics  . Smoking status: Former Games developer  . Smokeless tobacco: Never Used  . Alcohol Use: Yes     Comment: occasional  . Drug Use: No  . Sexual Activity: Yes    Birth Control/ Protection: Pill   Other Topics Concern  . Not on file   Social History Narrative  . No narrative on file   Review of Systems No cough, sneezing, congestion or other URI symptoms No fever No allergy problems Having some trouble reading fine print since the eye redness started    Objective:   Physical Exam  Constitutional: She appears well-developed and well-nourished. No distress.  Eyes:  Moderate conjunctival injection ---tarsal and bulbar Mild swelling No foreign body or evidence of trauma          Assessment & Plan:

## 2013-03-30 NOTE — Patient Instructions (Signed)
If your eyes are not better by Friday, please see your eye doctor.

## 2013-05-13 ENCOUNTER — Ambulatory Visit (INDEPENDENT_AMBULATORY_CARE_PROVIDER_SITE_OTHER): Payer: BC Managed Care – PPO | Admitting: Internal Medicine

## 2013-05-13 ENCOUNTER — Other Ambulatory Visit (HOSPITAL_COMMUNITY)
Admission: RE | Admit: 2013-05-13 | Discharge: 2013-05-13 | Disposition: A | Discharge status: Home / Self Care | Payer: BC Managed Care – PPO | Source: Ambulatory Visit | Attending: Internal Medicine | Admitting: Internal Medicine

## 2013-05-13 ENCOUNTER — Encounter: Payer: Self-pay | Admitting: Internal Medicine

## 2013-05-13 VITALS — BP 110/80 | HR 76 | Temp 98.8°F | Wt 230.2 lb

## 2013-05-13 DIAGNOSIS — Z3009 Encounter for other general counseling and advice on contraception: Secondary | ICD-10-CM

## 2013-05-13 DIAGNOSIS — Z124 Encounter for screening for malignant neoplasm of cervix: Secondary | ICD-10-CM

## 2013-05-13 DIAGNOSIS — Z01419 Encounter for gynecological examination (general) (routine) without abnormal findings: Secondary | ICD-10-CM

## 2013-05-13 DIAGNOSIS — Z113 Encounter for screening for infections with a predominantly sexual mode of transmission: Secondary | ICD-10-CM | POA: Insufficient documentation

## 2013-05-13 DIAGNOSIS — N760 Acute vaginitis: Secondary | ICD-10-CM | POA: Insufficient documentation

## 2013-05-13 MED ORDER — LEVONORGEST-ETH ESTRAD 91-DAY 0.15-0.03 &0.01 MG PO TABS: 1.0000 | ORAL_TABLET | Freq: Every day | ORAL | Status: DC

## 2013-05-13 NOTE — Progress Notes (Signed)
Pre-visit discussion using our clinic review tool. No additional management support is needed unless otherwise documented below in the visit note.  

## 2013-05-13 NOTE — Assessment & Plan Note (Signed)
Xanax refilled today. ?

## 2013-05-13 NOTE — Progress Notes (Signed)
   Subjective:    Patient ID: Gabriella Dennis, female    DOB: 10-Oct-1990, 23 y.o.   MRN: [id number]  HPI  Pt presents to the clinic today for her routine gyn exam. Her LMP was. Her last pap smear was. She also wants to discuss birth control options.  Additionally, she needs a refill of her xanax. She takes this as needed for anxiety and panic attacks.  Review of Systems      Past Medical History  Diagnosis Date  . Depression   . Allergy   . Nicotine addiction     Current Outpatient Prescriptions  Medication Sig Dispense Refill  . ALPRAZolam (XANAX) 1 MG tablet Take 1 mg by mouth as needed.       No current facility-administered medications for this visit.    No Known Allergies  Family History  Problem Relation Age of Onset  . Mental illness Father   . Parkinsonism Father   . Mental illness Brother     History   Social History  . Marital Status: Single    Spouse Name: N/A    Number of Children: N/A  . Years of Education: N/A   Occupational History  . Not on file.   Social History Main Topics  . Smoking status: Former Games developermoker  . Smokeless tobacco: Never Used  . Alcohol Use: Yes     Comment: occasional  . Drug Use: No  . Sexual Activity: Yes    Birth Control/ Protection: Pill   Other Topics Concern  . Not on file   Social History Narrative  . No narrative on file     Constitutional: Denies fever, malaise, fatigue, headache or abrupt weight changes.  Gastrointestinal: Denies abdominal pain, bloating, constipation, diarrhea or blood in the stool.  GU: Pt reports late menses. Denies urgency, frequency, pain with urination, burning sensation, blood in urine, odor or discharge.   No other specific complaints in a complete review of systems (except as listed in HPI above).  Objective:   Physical Exam  BP 110/80  Pulse 76  Temp(Src) 98.8 F (37.1 C) (Oral)  Wt 230 lb 4 oz (104.441 kg)  SpO2 98%  LMP 03/01/2013  Constitutional:  Alert, oriented x  4, well developed, well nourished in no apparent distress. Cardiovascular: Normal rate and rhythm. S1,S2 noted.  No murmur, rubs or gallops noted. No JVD or BLE edema. No carotid bruits noted. Pulmonary/Chest: Normal effort and positive vesicular breath sounds. No respiratory distress. No wheezes, rales or ronchi noted.  Abdomen: Soft and nontender. Normal bowel sounds, no bruits noted. No distention or masses noted. Liver, spleen and kidneys non palpable. Genitourinary: Normal female anatomy. Uterus midline, anterior and soft. No CMT or discharge noted. Adenexa palpable on the right. Breast without lumps or masses.       Assessment & Plan:   Routine GYN exam:  Pap Smear obtained today- will call/mail you your results today Breast exam today normal Birth control counseling today- discussed types, effectiveness and side effects. She would like to try seasonique, called in today  RTC in 1 year or sooner if needed

## 2013-05-13 NOTE — Patient Instructions (Signed)
Pap Test A Pap test checks the cells on the surface of your cervix. Your doctor will look for cell changes that are not normal, an infection, or cancer. If the cells no longer look normal, it is called dysplasia. Dysplasia can turn into cancer. Regular Pap tests are important to stop cancer from developing. BEFORE THE PROCEDURE  Ask your doctor when to schedule your Pap test. Timing the test around your period may be important.  Do not douche or have sex (intercourse) for 24 hours before the test.  Do not put creams on your vagina or use tampons for 24 hours before the test.  Go pee (urinate) just before the test. PROCEDURE  You will lie on an exam table with your feet in stirrups.  A warm metal or plastic tool (speculum) will be put in your vagina to open it up.  Your doctor will use a small, plastic brush or wooden spatula to take cells from your cervix.  The cells will be put in a lab container.  The cells will be checked under a microscope to see if they are normal or not. AFTER THE PROCEDURE Get your test results. If they are abnormal, you may need more tests. Document Released: 04/26/2010 Document Revised: 06/16/2011 Document Reviewed: 03/20/2011 [employer] Patient Information 2014 [employer].  

## 2013-07-20 ENCOUNTER — Ambulatory Visit (INDEPENDENT_AMBULATORY_CARE_PROVIDER_SITE_OTHER): Payer: BC Managed Care – PPO | Admitting: Family Medicine

## 2013-07-20 ENCOUNTER — Encounter: Payer: Self-pay | Admitting: Family Medicine

## 2013-07-20 VITALS — BP 110/70 | HR 64 | Temp 98.9°F | Wt 225.8 lb

## 2013-07-20 DIAGNOSIS — S161XXA Strain of muscle, fascia and tendon at neck level, initial encounter: Secondary | ICD-10-CM

## 2013-07-20 DIAGNOSIS — S139XXA Sprain of joints and ligaments of unspecified parts of neck, initial encounter: Secondary | ICD-10-CM

## 2013-07-20 DIAGNOSIS — S39012A Strain of muscle, fascia and tendon of lower back, initial encounter: Secondary | ICD-10-CM

## 2013-07-20 DIAGNOSIS — S29012A Strain of muscle and tendon of back wall of thorax, initial encounter: Secondary | ICD-10-CM | POA: Insufficient documentation

## 2013-07-20 DIAGNOSIS — S335XXA Sprain of ligaments of lumbar spine, initial encounter: Secondary | ICD-10-CM

## 2013-07-20 MED ORDER — DIAZEPAM 5 MG PO TABS: 2.5000 mg | ORAL_TABLET | Freq: Two times a day (BID) | ORAL | Status: DC | PRN

## 2013-07-20 NOTE — Progress Notes (Signed)
Pre visit review using our clinic review tool, if applicable. No additional management support is needed unless otherwise documented below in the visit note. 
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
## 2013-07-20 NOTE — Patient Instructions (Addendum)
I do think you had cervical and lumbar strain.  Treat with muscle relaxant (valium) and referral to physical therapy. Let us know if not improving as expected or any worsening. Pass by [facility]'s or [facility]'s office to schedule physical therapy

## 2013-07-20 NOTE — Assessment & Plan Note (Signed)
Exam consistent with cervical and lumbar strain - treat with valium (pt states has not tolerated other muscle relaxants in past) - sedation precautions discussed. Refer to PT per patient request, doesn't feel she would do well with HEP. Heating pad to cervical and lumbar back. Update if not improving as expected.

## 2013-07-26 ENCOUNTER — Telehealth: Payer: Self-pay | Admitting: Internal Medicine
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
## 2013-07-26 NOTE — Telephone Encounter (Signed)
Her using up #40 already is a good reason not to give any more----it is highly dependence provoking and addictive  Okay to Rx methocarbamol 500mg  tid prn #60 x 0

## 2013-07-27 ENCOUNTER — Other Ambulatory Visit: Payer: Self-pay | Admitting: Family Medicine

## 2013-07-27 MED ORDER — METHOCARBAMOL 500 MG PO TABS: 500.0000 mg | ORAL_TABLET | Freq: Three times a day (TID) | ORAL | Status: DC | PRN

## 2013-07-27 NOTE — Telephone Encounter (Signed)
.  left message to have patient return my call.  

## 2013-07-27 NOTE — Telephone Encounter (Signed)
Ok to refill? Last filled 07/20/13 # 40 with 0 RF by Dr. GReece Agar

## 2013-07-27 NOTE — Telephone Encounter (Signed)
Spoke with patient and advised results rx sent to pharmacy by e-script  

## 2013-07-28 NOTE — Telephone Encounter (Signed)
Way too soon See what is going on

## 2013-07-28 NOTE — Telephone Encounter (Signed)
Will not refill this.

## 2013-07-28 NOTE — Telephone Encounter (Signed)
See phone note from 07/26/13  "Her using up #40 already is a good reason not to give any more----it is highly dependence provoking and addictive  Okay to Rx methocarbamol 500mg  tid prn  #60 x 0" per Dr. Alphonsus SiasLetvak  [facility]

## 2013-07-29 NOTE — Telephone Encounter (Signed)
Denial sent back

## 2013-08-19 ENCOUNTER — Encounter: Payer: Self-pay | Admitting: Family Medicine

## 2013-08-19 ENCOUNTER — Ambulatory Visit (INDEPENDENT_AMBULATORY_CARE_PROVIDER_SITE_OTHER): Payer: BC Managed Care – PPO | Admitting: Family Medicine

## 2013-08-19 VITALS — BP 126/82 | HR 80 | Temp 98.6°F | Wt 220.2 lb

## 2013-08-19 DIAGNOSIS — S29012A Strain of muscle and tendon of back wall of thorax, initial encounter: Secondary | ICD-10-CM

## 2013-08-19 DIAGNOSIS — S139XXA Sprain of joints and ligaments of unspecified parts of neck, initial encounter: Secondary | ICD-10-CM

## 2013-08-19 DIAGNOSIS — S239XXA Sprain of unspecified parts of thorax, initial encounter: Secondary | ICD-10-CM

## 2013-08-19 DIAGNOSIS — S161XXA Strain of muscle, fascia and tendon at neck level, initial encounter: Secondary | ICD-10-CM

## 2013-08-19 MED ORDER — NAPROXEN 500 MG PO TABS: ORAL_TABLET | ORAL | Status: DC

## 2013-08-19 MED ORDER — METAXALONE 800 MG PO TABS: 400.0000 mg | ORAL_TABLET | Freq: Three times a day (TID) | ORAL | Status: DC | PRN

## 2013-08-19 NOTE — Patient Instructions (Signed)
I think you have persistent thoracic and neck strain - along with some scoliosis noted today. I recommend short course of prescription strength anti inflammatory - don't take with OTC variety. Also try skelaxin muscle relaxant Continue TENS unit use and physical therapy. If not improving in next 2-3 weeks, call me for xray of thoracic spine to further evaluate scoliosis
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
## 2013-08-19 NOTE — Assessment & Plan Note (Signed)
With thoracic scoliosis. Continue PT and TENS unit use. Overused valium, robaxin causing nausea. Will try skelaxin 1/2 - 1 tablet prn and do short course of prescription strength NSAID. If not improving, return for xrays of thoracic and cervical spine to further eval scoliosis recently noted by chiro.

## 2013-08-19 NOTE — Progress Notes (Signed)
Pre visit review using our clinic review tool, if applicable. No additional management support is needed unless otherwise documented below in the visit note. 

## 2013-08-19 NOTE — Assessment & Plan Note (Signed)
Exam consistent with this. Overused valium, robaxin causing nausea. Will try skelaxin 1/2 - 1 tablet prn and do short course of prescription strength NSAID. Continue PT.

## 2013-08-26 ENCOUNTER — Encounter: Payer: Self-pay | Admitting: Family Medicine

## 2013-08-26 ENCOUNTER — Ambulatory Visit (INDEPENDENT_AMBULATORY_CARE_PROVIDER_SITE_OTHER): Payer: BC Managed Care – PPO | Admitting: Family Medicine

## 2013-08-26 VITALS — BP 110/76 | HR 90 | Temp 98.1°F | Ht 68.0 in | Wt 219.8 lb

## 2013-08-26 DIAGNOSIS — J069 Acute upper respiratory infection, unspecified: Secondary | ICD-10-CM

## 2013-08-26 NOTE — Progress Notes (Signed)
Pre visit review using our clinic review tool, if applicable. No additional management support is needed unless otherwise documented below in the visit note. 

## 2013-08-26 NOTE — Assessment & Plan Note (Signed)
Symptom care. 

## 2013-08-26 NOTE — Progress Notes (Signed)
   Subjective:    Patient ID: Gabriella Dennis, female    DOB: 06-28-1990, 23 y.o.   MRN: [id number]  Cough The current episode started yesterday. The problem has been gradually worsening. The cough is non-productive. Associated symptoms include a fever, nasal congestion and a sore throat. Pertinent negatives include no ear congestion, headaches, shortness of breath or wheezing. Risk factors: non smoker. She has tried nothing for the symptoms. The treatment provided mild relief. There is no history of asthma, COPD or environmental allergies.  Sore Throat  This is a new problem. The current episode started today. Neither side of throat is experiencing more pain than the other. Associated symptoms include coughing, swollen glands and trouble swallowing. Pertinent negatives include no headaches or shortness of breath.  Fever  This is a new problem. The current episode started today. The maximum temperature noted was 100 to 100.9 F. Associated symptoms include coughing and a sore throat. Pertinent negatives include no headaches or wheezing. She has tried NSAIDs for the symptoms. The treatment provided significant relief.      Review of Systems  Constitutional: Positive for fever.  HENT: Positive for sore throat and trouble swallowing.   Respiratory: Positive for cough. Negative for shortness of breath and wheezing.   Allergic/Immunologic: Negative for environmental allergies.  Neurological: Negative for headaches.       Objective:   Physical Exam  Constitutional: Vital signs are normal. She appears well-developed and well-nourished. She is cooperative.  Non-toxic appearance. She does not appear ill. No distress.  HENT:  Head: Normocephalic.  Right Ear: Hearing, tympanic membrane, external ear and ear canal normal. Tympanic membrane is not erythematous, not retracted and not bulging.  Left Ear: Hearing, tympanic membrane, external ear and ear canal normal. Tympanic membrane is not  erythematous, not retracted and not bulging.  Nose: Mucosal edema and rhinorrhea present. Right sinus exhibits no maxillary sinus tenderness and no frontal sinus tenderness. Left sinus exhibits no maxillary sinus tenderness and no frontal sinus tenderness.  Mouth/Throat: Uvula is midline, oropharynx is clear and moist and mucous membranes are normal.  Eyes: Conjunctivae, EOM and lids are normal. Pupils are equal, round, and reactive to light. Lids are everted and swept, no foreign bodies found.  Neck: Trachea normal and normal range of motion. Neck supple. Carotid bruit is not present. No mass and no thyromegaly present.  Cardiovascular: Normal rate, regular rhythm, S1 normal, S2 normal, normal heart sounds, intact distal pulses and normal pulses.  Exam reveals no gallop and no friction rub.   No murmur heard. Pulmonary/Chest: Effort normal and breath sounds normal. Not tachypneic. No respiratory distress. She has no decreased breath sounds. She has no wheezes. She has no rhonchi. She has no rales.  Neurological: She is alert.  Skin: Skin is warm, dry and intact. No rash noted.  Psychiatric: Her speech is normal and behavior is normal. Judgment normal. Her mood appears not anxious. Cognition and memory are normal. She does not exhibit a depressed mood.          Assessment & Plan:

## 2013-08-26 NOTE — Patient Instructions (Signed)
Push fluids, rest.  Use Mucinex DM for cough. Expect symptoms to move from head to chest, will take likely 7-10 days to resolve.  Call if cough keeping you up at [place] for cough suppressant.
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
## 2013-12-19 ENCOUNTER — Encounter (INDEPENDENT_AMBULATORY_CARE_PROVIDER_SITE_OTHER): Payer: Self-pay

## 2013-12-19 ENCOUNTER — Ambulatory Visit (INDEPENDENT_AMBULATORY_CARE_PROVIDER_SITE_OTHER): Payer: BC Managed Care – PPO | Admitting: Internal Medicine

## 2013-12-19 ENCOUNTER — Encounter: Payer: Self-pay | Admitting: Internal Medicine

## 2013-12-19 VITALS — BP 106/60 | HR 59 | Temp 98.8°F | Wt 226.0 lb

## 2013-12-19 DIAGNOSIS — K645 Perianal venous thrombosis: Secondary | ICD-10-CM

## 2013-12-19 MED ORDER — HYDROCORTISONE 2.5 % RE CREA: 1.0000 "application " | TOPICAL_CREAM | Freq: Two times a day (BID) | RECTAL | Status: DC

## 2013-12-19 MED ORDER — ACETAMINOPHEN-CODEINE #3 300-30 MG PO TABS: 1.0000 | ORAL_TABLET | ORAL | Status: DC | PRN

## 2013-12-19 NOTE — Patient Instructions (Signed)

## 2013-12-19 NOTE — Progress Notes (Signed)
Subjective:    Patient ID: Gabriella Dennis, female    DOB: 1990-07-01, 23 y.o.   MRN: [id number]  HPI  Pt presents to the clinic today with c/o external hemorroids. She noticed them 1 week ago. They are very painful. The pain seems to be worse with having a BM and with walking. They were bleeding in the beginning. She is having BM's daily. She reports that she does not strain. She reports that she has had hemorrhoids twice before. She has tried OTC hemorrhoid cream without relief.  Review of Systems      Past Medical History  Diagnosis Date  . Depression   . Allergy   . Nicotine addiction     Current Outpatient Prescriptions  Medication Sig Dispense Refill  . ALPRAZolam (XANAX) 1 MG tablet Take 1 mg by mouth as needed.      Marland Kitchen amphetamine-dextroamphetamine (ADDERALL) 20 MG tablet Take 20 mg by mouth daily.       . Levonorgestrel-Ethinyl Estradiol (AMETHIA,CAMRESE) 0.15-0.03 &0.01 MG tablet Take 1 tablet by mouth daily.  1 Package  4  . metaxalone (SKELAXIN) 800 MG tablet Take 0.5-1 tablets (400-800 mg total) by mouth 3 (three) times daily as needed for muscle spasms (sedation precautions).  50 tablet  0  . naproxen (NAPROSYN) 500 MG tablet Take one po bid x 1 week then prn pain, take with food  50 tablet  0   No current facility-administered medications for this visit.    No Known Allergies  Family History  Problem Relation Age of Onset  . Mental illness Father   . Parkinsonism Father   . Mental illness Brother     History   Social History  . Marital Status: Single    Spouse Name: N/A    Number of Children: N/A  . Years of Education: N/A   Occupational History  . Not on file.   Social History Main Topics  . Smoking status: Former Research scientist (life sciences)  . Smokeless tobacco: Never Used  . Alcohol Use: Yes     Comment: occasional  . Drug Use: No  . Sexual Activity: Yes    Birth Control/ Protection: Pill   Other Topics Concern  . Not on file   Social History Narrative  .  No narrative on file     Constitutional: Denies fever, malaise, fatigue, headache or abrupt weight changes.  Gastrointestinal: Pt reports hemorrhoids. Denies abdominal pain, bloating, constipation, diarrhea.   No other specific complaints in a complete review of systems (except as listed in HPI above).  Objective:   Physical Exam   BP 106/60  Pulse 59  Temp(Src) 98.8 F (37.1 C) (Oral)  Wt 226 lb (102.513 kg)  SpO2 98%  LMP 11/26/2013 Wt Readings from Last 3 Encounters:  12/19/13 226 lb (102.513 kg)  08/26/13 219 lb 12 oz (99.678 kg)  08/19/13 220 lb 4 oz (99.905 kg)    General: Appears her stated age, obese but well developed, well nourished in NAD. Cardiovascular: Normal rate and rhythm. S1,S2 noted.  No murmur, rubs or gallops noted.  Pulmonary/Chest: Normal effort and positive vesicular breath sounds. No respiratory distress. No wheezes, rales or ronchi noted.  Abdomen: Soft and nontender. Normal bowel sounds, no bruits noted. No distention or masses noted. Liver, spleen and kidneys non palpable. Rectal: External hemorrhoid noted, swollen, hard, ? Thrombosed.   BMET    Component Value Date/Time   NA 137 05/12/2010 1805   K 3.8 05/12/2010 1805   CL 103  05/12/2010 1805   CO2 25 02/05/2010 1026   GLUCOSE 98 05/12/2010 1805   BUN 6 05/12/2010 1805   CREATININE 0.8 05/12/2010 1805   CALCIUM 9.0 02/05/2010 1026   GFRNONAA >60 02/05/2010 1026   GFRAA  Value: >60        The eGFR has been calculated using the MDRD equation. This calculation has not been validated in all clinical situations. eGFR's persistently <60 mL/min signify possible Chronic Kidney Disease. 02/05/2010 1026    Lipid Panel  No results found for this basename: chol, trig, hdl, cholhdl, vldl, ldlcalc    CBC    Component Value Date/Time   WBC 11.7* 02/05/2010 1026   RBC 4.94 02/05/2010 1026   HGB 15.3* 05/12/2010 1805   HCT 45.0 05/12/2010 1805   PLT 192 02/05/2010 1026   MCV 90.1 02/05/2010 1026   MCH 31.1 02/05/2010  1026   MCHC 34.5 02/05/2010 1026   RDW 12.3 02/05/2010 1026   LYMPHSABS 2.0 02/05/2010 1026   MONOABS 0.6 02/05/2010 1026   EOSABS 0.0 02/05/2010 1026   BASOSABS 0.0 02/05/2010 1026    Hgb A1C No results found for this basename: HGBA1C        Assessment & Plan:   External hemorrhoid, ? Thrombosed:  Will give RX for Anusol cream to affected area BID Try Colace daily to make the stool softer and easier to pass RX for Tylenol # 3 for pain relief Drink plenty of fluids over the next few days Will refer to gen surg for further eval  RTC as needed or if symptoms persist or worsen

## 2013-12-19 NOTE — Progress Notes (Signed)
Pre visit review using our clinic review tool, if applicable. No additional management support is needed unless otherwise documented below in the visit note. 

## 2014-02-01 ENCOUNTER — Ambulatory Visit (INDEPENDENT_AMBULATORY_CARE_PROVIDER_SITE_OTHER): Payer: BC Managed Care – PPO | Admitting: Internal Medicine

## 2014-02-01 ENCOUNTER — Encounter: Payer: Self-pay | Admitting: Internal Medicine

## 2014-02-01 VITALS — BP 110/70 | HR 80 | Temp 98.1°F | Wt 226.0 lb

## 2014-02-01 DIAGNOSIS — B354 Tinea corporis: Secondary | ICD-10-CM | POA: Insufficient documentation

## 2014-02-01 MED ORDER — KETOCONAZOLE 2 % EX CREA: 1.0000 "application " | TOPICAL_CREAM | Freq: Two times a day (BID) | CUTANEOUS | Status: DC

## 2014-02-01 NOTE — Progress Notes (Signed)
Pre visit review using our clinic review tool, if applicable. No additional management support is needed unless otherwise documented below in the visit note. 

## 2014-02-01 NOTE — Assessment & Plan Note (Signed)
Clearly seems fungal Will try ketoconazole cream If doesn't clear completely, will try fluconazole

## 2014-02-01 NOTE — Progress Notes (Signed)
   Subjective:    Patient ID: Gabriella Dennis, female    DOB: 1990/11/19, 23 y.o.   MRN: [id number]  HPI Rash on forearms that won't go away Had one small spot about 6 months ago that was a circle Faded with diaper rash cream  Not really itchy---except in 3 small inflamed spots on left side Does scale some  Hydrocortisone cream helps the scaling but hasn't cleared it Doesn't seem to have passed it on to anyone  Current Outpatient Prescriptions on File Prior to Visit  Medication Sig Dispense Refill  . amphetamine-dextroamphetamine (ADDERALL) 20 MG tablet Take 20 mg by mouth daily.       . diazepam (VALIUM) 10 MG tablet Take 10 mg by mouth as needed for anxiety.      . Levonorgestrel-Ethinyl Estradiol (AMETHIA,CAMRESE) 0.15-0.03 &0.01 MG tablet Take 1 tablet by mouth daily.  1 Package  4   No current facility-administered medications on file prior to visit.    No Known Allergies  Past Medical History  Diagnosis Date  . Depression   . Allergy   . Nicotine addiction     No past surgical history on file.  Family History  Problem Relation Age of Onset  . Mental illness Father   . Parkinsonism Father   . Mental illness Brother     History   Social History  . Marital Status: Single    Spouse Name: N/A    Number of Children: N/A  . Years of Education: N/A   Occupational History  . Not on file.   Social History Main Topics  . Smoking status: Former Games developermoker  . Smokeless tobacco: Never Used  . Alcohol Use: Yes     Comment: occasional  . Drug Use: No  . Sexual Activity: Yes    Birth Control/ Protection: Pill   Other Topics Concern  . Not on file   Social History Narrative  . No narrative on file   Review of Systems No fever Hasn't been sick    Objective:   Physical Exam  Skin:  Irregular circular redness which is raised with central clearing on both arms (most distinct on right) 3 slightly inflamed papules on left Diffuse raised scaly areas more on left            Assessment & Plan:

## 2014-02-01 NOTE — Patient Instructions (Signed)
Let me know if the rash isn't much better within 2 weeks, I will send a prescription for an oral medication.

## 2014-02-14 ENCOUNTER — Encounter: Payer: Self-pay | Admitting: Internal Medicine

## 2014-02-14 DIAGNOSIS — R21 Rash and other nonspecific skin eruption: Secondary | ICD-10-CM

## 2014-02-14 MED ORDER — FLUCONAZOLE 100 MG PO TABS: 100.0000 mg | ORAL_TABLET | Freq: Every day | ORAL | Status: DC

## 2014-03-07 MED ORDER — DESONIDE 0.05 % EX CREA: TOPICAL_CREAM | Freq: Two times a day (BID) | CUTANEOUS | Status: DC | PRN

## 2014-03-11 ENCOUNTER — Encounter (HOSPITAL_COMMUNITY): Payer: Self-pay | Admitting: *Deleted

## 2014-03-11 ENCOUNTER — Inpatient Hospital Stay (HOSPITAL_COMMUNITY)
Admission: AD | Admit: 2014-03-11 | Discharge: 2014-03-11 | Disposition: A | Discharge status: Home / Self Care | Payer: BC Managed Care – PPO | Source: Ambulatory Visit | Attending: Family Medicine | Admitting: Family Medicine

## 2014-03-11 DIAGNOSIS — E282 Polycystic ovarian syndrome: Secondary | ICD-10-CM | POA: Insufficient documentation

## 2014-03-11 DIAGNOSIS — Z87891 Personal history of nicotine dependence: Secondary | ICD-10-CM | POA: Insufficient documentation

## 2014-03-11 DIAGNOSIS — N912 Amenorrhea, unspecified: Secondary | ICD-10-CM | POA: Diagnosis present

## 2014-03-11 DIAGNOSIS — N3000 Acute cystitis without hematuria: Secondary | ICD-10-CM

## 2014-03-11 DIAGNOSIS — N390 Urinary tract infection, site not specified: Secondary | ICD-10-CM | POA: Insufficient documentation

## 2014-03-11 LAB — URINE MICROSCOPIC-ADD ON

## 2014-03-11 LAB — URINALYSIS, ROUTINE W REFLEX MICROSCOPIC
Bilirubin Urine: NEGATIVE
GLUCOSE, UA: NEGATIVE mg/dL
Ketones, ur: NEGATIVE mg/dL
Leukocytes, UA: NEGATIVE
Nitrite: POSITIVE — AB
PROTEIN: NEGATIVE mg/dL
Specific Gravity, Urine: >1.03 — ABNORMAL HIGH (ref 1.005–1.030)
UROBILINOGEN UA: 0.2 mg/dL (ref 0.0–1.0)
pH: 5.5 (ref 5.0–8.0)

## 2014-03-11 LAB — RPR

## 2014-03-11 LAB — WET PREP, GENITAL
Trich, Wet Prep: NONE SEEN
Yeast Wet Prep HPF POC: NONE SEEN

## 2014-03-11 LAB — POCT PREGNANCY, URINE: Preg Test, Ur: NEGATIVE

## 2014-03-11 LAB — HIV ANTIBODY (ROUTINE TESTING W REFLEX): HIV 1&2 Ab, 4th Generation: NONREACTIVE

## 2014-03-11 MED ORDER — METRONIDAZOLE 500 MG PO TABS: 500.0000 mg | ORAL_TABLET | Freq: Once | ORAL | Status: DC

## 2014-03-11 MED ORDER — METRONIDAZOLE 500 MG PO TABS: 500.0000 mg | ORAL_TABLET | Freq: Two times a day (BID) | ORAL | Status: DC

## 2014-03-11 MED ORDER — ETONOGESTREL-ETHINYL ESTRADIOL 0.12-0.015 MG/24HR VA RING: VAGINAL_RING | VAGINAL | Status: DC

## 2014-03-11 MED ORDER — NITROFURANTOIN MONOHYD MACRO 100 MG PO CAPS: 100.0000 mg | ORAL_CAPSULE | Freq: Two times a day (BID) | ORAL | Status: DC

## 2014-03-11 MED ORDER — NITROFURANTOIN MONOHYD MACRO 100 MG PO CAPS: 100.0000 mg | ORAL_CAPSULE | Freq: Once | ORAL | Status: DC

## 2014-03-11 NOTE — MAU Provider Note (Signed)
History     CSN: [id number]  Arrival date and time: 03/11/14 1357   None     Chief Complaint  Patient presents with  . Abdominal Pain   HPIpt is not pregnant [id number] who presents with amenorrhea for 3 mos.  Pt now has some abd cramping.pt has had negative UPT..  Pt has previously been evaluated at [facility] and told she had PCO.  Pt was put on OCs.  Pt had expensive workup and has outstanding bills, so has not follow up with OB-GYN.  Pt has seen FP for refill of OC- pt stopped taking OCs because she didn't want to take a pill every day.   Pt has hx of amenorrhea and irregular cycles with one episode of amenorrhea for 1 year.  Pt denies heavy menses. Pt denies vaginal discharge.   Gabriella Cher Nakaiena Morris, RN Registered Nurse Signed Nursing MAU Note 03/11/2014 2:53 PM    Expand All Collapse All   Pt presents to MAU with complaints of lower abdominal pain, with no menstrual cycle x 3 months. Reports she stopped taking birth control 8 months ago and has had a regular cycle until 3 months ago.       Past Medical History  Diagnosis Date  . Depression   . Allergy   . Nicotine addiction     Past Surgical History  Procedure Laterality Date  . Wisdom tooth extraction      Family History  Problem Relation Age of Onset  . Mental illness Father   . Parkinsonism Father   . Mental illness Brother     History  Substance Use Topics  . Smoking status: Former Games developermoker  . Smokeless tobacco: Never Used  . Alcohol Use: Yes     Comment: occasional    Allergies: No Known Allergies  Prescriptions prior to admission  Medication Sig Dispense Refill Last Dose  . amphetamine-dextroamphetamine (ADDERALL) 20 MG tablet Take 20 mg by mouth daily.    Past Week at Unknown time  . desonide (DESOWEN) 0.05 % cream Apply topically 2 (two) times daily as needed. (Patient taking differently: Apply 1 application topically 2 (two) times daily as needed (for eczema). ) 60 g 1 03/10/2014 at Unknown time   . diazepam (VALIUM) 10 MG tablet Take 10 mg by mouth every 8 (eight) hours as needed for anxiety.    03/10/2014 at Unknown time  . fluconazole (DIFLUCAN) 100 MG tablet Take 1 tablet (100 mg total) by mouth daily. 7 tablet 1 03/11/2014 at Unknown time  . ketoconazole (NIZORAL) 2 % cream Apply 1 application topically 2 (two) times daily. 60 g 1 03/11/2014 at Unknown time  . Levonorgestrel-Ethinyl Estradiol (AMETHIA,CAMRESE) 0.15-0.03 &0.01 MG tablet Take 1 tablet by mouth daily. (Patient not taking: Reported on 03/11/2014) 1 Package 4 Taking    Review of Systems  Constitutional: Negative for fever and chills.  Gastrointestinal: Positive for abdominal pain. Negative for nausea and vomiting.  Genitourinary: Negative for dysuria.   Physical Exam   Blood pressure 136/85, pulse 60, temperature 98.2 F (36.8 C), resp. rate 16, last menstrual period 12/20/2013.  Physical Exam  Nursing note and vitals reviewed. Constitutional: She is oriented to person, place, and time. She appears well-developed and well-nourished. No distress.  HENT:  Head: Normocephalic.  Eyes: Pupils are equal, round, and reactive to light.  Neck: Normal range of motion. Neck supple.  Cardiovascular: Normal rate.   Respiratory: Effort normal.  GI: Soft. She exhibits no distension.  Genitourinary: Vagina normal and  uterus normal. No vaginal discharge found.  nontender  Musculoskeletal: Normal range of motion.  Neurological: She is alert and oriented to person, place, and time.  Skin: Skin is warm and dry.  Psychiatric: She has a normal mood and affect.    MAU Course  Procedures Results for orders placed or performed during the hospital encounter of 03/11/14 (from the past 24 hour(s))  Urinalysis, Routine w reflex microscopic     Status: Abnormal   Collection Time: 03/11/14  2:03 PM  Result Value Ref Range   Color, Urine YELLOW YELLOW   APPearance CLEAR CLEAR   Specific Gravity, Urine >1.030 (H) 1.005 - 1.030   pH  5.5 5.0 - 8.0   Glucose, UA NEGATIVE NEGATIVE mg/dL   Hgb urine dipstick TRACE (A) NEGATIVE   Bilirubin Urine NEGATIVE NEGATIVE   Ketones, ur NEGATIVE NEGATIVE mg/dL   Protein, ur NEGATIVE NEGATIVE mg/dL   Urobilinogen, UA 0.2 0.0 - 1.0 mg/dL   Nitrite POSITIVE (A) NEGATIVE   Leukocytes, UA NEGATIVE NEGATIVE  Urine microscopic-add on     Status: Abnormal   Collection Time: 03/11/14  2:03 PM  Result Value Ref Range   Squamous Epithelial / LPF FEW (A) RARE   WBC, UA 3-6 <3 WBC/hpf   RBC / HPF 0-2 <3 RBC/hpf   Bacteria, UA MANY (A) RARE  Pregnancy, urine POC     Status: None   Collection Time: 03/11/14  2:14 PM  Result Value Ref Range   Preg Test, Ur NEGATIVE NEGATIVE  Wet prep, genital     Status: Abnormal   Collection Time: 03/11/14  3:55 PM  Result Value Ref Range   Yeast Wet Prep HPF POC NONE SEEN NONE SEEN   Trich, Wet Prep NONE SEEN NONE SEEN   Clue Cells Wet Prep HPF POC FEW (A) NONE SEEN   WBC, Wet Prep HPF POC FEW (A) NONE SEEN  long discussion about PCOS and importance of diet and control of hormones Discussed option of NuvaRing- which pt is willing to try UTI Urine culture pending Assessment and Plan  Amenorrhea; PCOS- NuvaRing- [facility]  UTI- Macrobid BID for 7 days    Kamin Niblack 03/11/2014, 3:11 PM

## 2014-03-11 NOTE — MAU Note (Signed)
Pt presents to MAU with complaints of lower abdominal pain, with no menstrual cycle x 3 months. Reports she stopped taking birth control 8 months ago and has had a regular cycle until 3 months ago.

## 2014-03-13 LAB — GC/CHLAMYDIA PROBE AMP
CT Probe RNA: NEGATIVE
GC PROBE AMP APTIMA: NEGATIVE

## 2014-03-14 LAB — URINE CULTURE
Colony Count: >=100000
Special Requests: NORMAL

## 2014-04-26 ENCOUNTER — Telehealth: Payer: Self-pay

## 2014-04-26 NOTE — Telephone Encounter (Signed)
Pt starting [employer] and pt wants tdap and mmr; pt will ck with health dept and high school for immunization record and if needs immunizations pt will cb.

## 2014-05-30 ENCOUNTER — Encounter: Payer: Self-pay | Admitting: Family Medicine

## 2014-05-30 ENCOUNTER — Ambulatory Visit (INDEPENDENT_AMBULATORY_CARE_PROVIDER_SITE_OTHER): Payer: BLUE CROSS/BLUE SHIELD | Admitting: Family Medicine

## 2014-05-30 ENCOUNTER — Telehealth: Payer: Self-pay | Admitting: Internal Medicine

## 2014-05-30 VITALS — BP 120/90 | HR 67 | Temp 98.1°F | Ht 68.0 in | Wt 228.8 lb

## 2014-05-30 DIAGNOSIS — S161XXA Strain of muscle, fascia and tendon at neck level, initial encounter: Secondary | ICD-10-CM | POA: Insufficient documentation

## 2014-05-30 DIAGNOSIS — R51 Headache: Secondary | ICD-10-CM

## 2014-05-30 DIAGNOSIS — S39012A Strain of muscle, fascia and tendon of lower back, initial encounter: Secondary | ICD-10-CM

## 2014-05-30 DIAGNOSIS — R519 Headache, unspecified: Secondary | ICD-10-CM | POA: Insufficient documentation

## 2014-05-30 DIAGNOSIS — G44319 Acute post-traumatic headache, not intractable: Secondary | ICD-10-CM

## 2014-05-30 MED ORDER — DICLOFENAC SODIUM 75 MG PO TBEC: 75.0000 mg | DELAYED_RELEASE_TABLET | Freq: Two times a day (BID) | ORAL | Status: DC

## 2014-05-30 MED ORDER — TRAMADOL HCL 50 MG PO TABS: 50.0000 mg | ORAL_TABLET | Freq: Three times a day (TID) | ORAL | Status: DC | PRN

## 2014-05-30 NOTE — Assessment & Plan Note (Signed)
NSAIDs, heat, start gentle stretching. No indication for X-ray at this time.

## 2014-05-30 NOTE — Progress Notes (Signed)
Pre visit review using our clinic review tool, if applicable. No additional management support is needed unless otherwise documented below in the visit note. 

## 2014-05-30 NOTE — Telephone Encounter (Signed)
Okay 
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
## 2014-05-30 NOTE — Patient Instructions (Addendum)
Start diclofenac twice daily for inflammation and pain. Continue valium for mood and muscle spasm at night.  Can use tramadol for break thorugh pain  Heat on neck and low back.  Start home stretches of neck and low back.  Follow up with PCP in 2 weeks if not improving as expected. Stay out of work until Friday 06/02/2014.
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
## 2014-05-30 NOTE — Assessment & Plan Note (Signed)
NSAIDs, heat, start gentle stretching. No indication for X-ray at this time. 

## 2014-05-31 ENCOUNTER — Ambulatory Visit: Payer: Self-pay | Admitting: Family Medicine

## 2014-06-02 ENCOUNTER — Other Ambulatory Visit: Payer: Self-pay | Admitting: Internal Medicine

## 2014-06-05 NOTE — Telephone Encounter (Signed)
Please advise if this is correct--the last Rx states it was to stop at discharge--please advise

## 2014-06-05 NOTE — Telephone Encounter (Signed)
Pt needs physical appt with PCP

## 2014-06-06 ENCOUNTER — Telehealth: Payer: Self-pay

## 2014-06-06 NOTE — Telephone Encounter (Signed)
Pt needs to be seen

## 2014-06-06 NOTE — Telephone Encounter (Signed)
Appointment scheduled 06/08/2014 at 4:15 pm with Dr. Alphonsus SiasLetvak.
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
## 2014-06-08 ENCOUNTER — Encounter: Payer: Self-pay | Admitting: Internal Medicine

## 2014-06-08 ENCOUNTER — Ambulatory Visit (INDEPENDENT_AMBULATORY_CARE_PROVIDER_SITE_OTHER): Payer: BLUE CROSS/BLUE SHIELD | Admitting: Internal Medicine

## 2014-06-08 VITALS — BP 118/80 | HR 95 | Temp 98.4°F | Wt 230.4 lb

## 2014-06-08 DIAGNOSIS — S39012D Strain of muscle, fascia and tendon of lower back, subsequent encounter: Secondary | ICD-10-CM

## 2014-06-08 DIAGNOSIS — G44201 Tension-type headache, unspecified, intractable: Secondary | ICD-10-CM

## 2014-06-08 MED ORDER — TRAMADOL HCL 50 MG PO TABS: 50.0000 mg | ORAL_TABLET | Freq: Three times a day (TID) | ORAL | Status: DC | PRN

## 2014-06-08 NOTE — Telephone Encounter (Signed)
Called pt and she stated she does not want the Rx refilled at this time--i did remind pt her CPE is due

## 2014-06-08 NOTE — Assessment & Plan Note (Signed)
Fortunately doesn't seem to be concussion type Muscle tension Will continue the diclofenac or OTC NSAID if she prefers Tramadol for prn (especially in hopes of letting her be able to get to work)

## 2014-06-08 NOTE — Progress Notes (Signed)
Pre visit review using our clinic review tool, if applicable. No additional management support is needed unless otherwise documented below in the visit note. 
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
## 2014-06-08 NOTE — Assessment & Plan Note (Signed)
This is limiting her at work the most Will make referral for PT tramadol

## 2014-06-14 ENCOUNTER — Encounter: Payer: Self-pay | Admitting: Internal Medicine

## 2014-06-14 DIAGNOSIS — G44321 Chronic post-traumatic headache, intractable: Secondary | ICD-10-CM

## 2014-07-03 ENCOUNTER — Ambulatory Visit: Payer: BLUE CROSS/BLUE SHIELD | Admitting: Internal Medicine

## 2014-07-05 ENCOUNTER — Ambulatory Visit
Admit: 2014-07-05 | Disposition: A | Discharge status: Home / Self Care | Payer: Self-pay | Attending: Family Medicine | Admitting: Family Medicine

## 2014-07-07 ENCOUNTER — Ambulatory Visit (INDEPENDENT_AMBULATORY_CARE_PROVIDER_SITE_OTHER): Payer: BLUE CROSS/BLUE SHIELD | Admitting: *Deleted

## 2014-07-07 ENCOUNTER — Emergency Department
Admit: 2014-07-07 | Disposition: A | Discharge status: Home / Self Care | Payer: Self-pay | Admitting: Emergency Medicine

## 2014-07-07 ENCOUNTER — Encounter: Payer: Self-pay | Admitting: Obstetrics & Gynecology

## 2014-07-07 VITALS — BP 127/81 | HR 74 | Wt 230.2 lb

## 2014-07-07 DIAGNOSIS — O200 Threatened abortion: Secondary | ICD-10-CM | POA: Diagnosis not present

## 2014-07-07 LAB — CBC
HCT: 44.4 % (ref 35.0–47.0)
HGB: 14.6 g/dL (ref 12.0–16.0)
MCH: 30.5 pg (ref 26.0–34.0)
MCHC: 32.9 g/dL (ref 32.0–36.0)
MCV: 93 fL (ref 80–100)
PLATELETS: 203 10*3/uL (ref 150–440)
RBC: 4.79 10*6/uL (ref 3.80–5.20)
RDW: 12.3 % (ref 11.5–14.5)
WBC: 7 10*3/uL (ref 3.6–11.0)

## 2014-07-07 LAB — HCG, QUANTITATIVE, PREGNANCY: Beta Hcg, Quant.: 543 m[IU]/mL — ABNORMAL HIGH

## 2014-07-07 NOTE — Progress Notes (Signed)
Periods are irregular due to PCOS, [facility], heartbeat not confirmed.  Told patient was early pregnancy and roughly 5wk 2day.  HCG level from 3/28 was 139.1. History of missed AB x 3.

## 2014-07-07 NOTE — Progress Notes (Signed)
Bedside ultrasound today shows no gestational sac, no fetal pole, no pregnancy.  Beta HCG repeated today.  Patient will follow up in 1 week.

## 2014-07-08 LAB — ABO AND RH: RH TYPE: POSITIVE

## 2014-07-08 LAB — HCG, QUANTITATIVE, PREGNANCY: HCG, BETA CHAIN, QUANT, S: 440.3 m[IU]/mL

## 2014-07-13 ENCOUNTER — Emergency Department
Admit: 2014-07-13 | Disposition: A | Discharge status: Home / Self Care | Payer: Self-pay | Admitting: Emergency Medicine

## 2014-07-13 LAB — CBC
HCT: 43.3 % (ref 35.0–47.0)
HGB: 14.7 g/dL (ref 12.0–16.0)
MCH: 30.7 pg (ref 26.0–34.0)
MCHC: 33.9 g/dL (ref 32.0–36.0)
MCV: 91 fL (ref 80–100)
Platelet: 121 10*3/uL — ABNORMAL LOW (ref 150–440)
RBC: 4.79 10*6/uL (ref 3.80–5.20)
RDW: 12 % (ref 11.5–14.5)
WBC: 6.9 10*3/uL (ref 3.6–11.0)

## 2014-07-13 LAB — URINALYSIS, COMPLETE
BACTERIA: NONE SEEN
Bilirubin,UR: NEGATIVE
Glucose,UR: NEGATIVE mg/dL (ref 0–75)
Ketone: NEGATIVE
LEUKOCYTE ESTERASE: NEGATIVE
Nitrite: NEGATIVE
Ph: 5 (ref 4.5–8.0)
Specific Gravity: 1.031 (ref 1.003–1.030)

## 2014-07-13 LAB — BASIC METABOLIC PANEL
Anion Gap: 10 (ref 7–16)
BUN: 15 mg/dL
CALCIUM: 9 mg/dL
CHLORIDE: 102 mmol/L
CO2: 23 mmol/L
Creatinine: 0.67 mg/dL
EGFR (African American): >60
EGFR (Non-African Amer.): >60
Glucose: 84 mg/dL
POTASSIUM: 4.2 mmol/L
Sodium: 135 mmol/L

## 2014-07-13 LAB — HCG, QUANTITATIVE, PREGNANCY: BETA HCG, QUANT.: 1244 m[IU]/mL — AB

## 2014-07-14 ENCOUNTER — Ambulatory Visit: Payer: BLUE CROSS/BLUE SHIELD | Admitting: Obstetrics and Gynecology

## 2014-07-17 ENCOUNTER — Emergency Department
Admit: 2014-07-17 | Disposition: A | Discharge status: Home / Self Care | Payer: Self-pay | Admitting: Emergency Medicine

## 2014-07-17 LAB — COMPREHENSIVE METABOLIC PANEL
ALK PHOS: 118 U/L
Albumin: 3.5 g/dL
Albumin: 4.1 g/dL
Alkaline Phosphatase: 103 U/L
Anion Gap: 7 (ref 7–16)
Anion Gap: 5 — ABNORMAL LOW (ref 7–16)
BILIRUBIN TOTAL: 0.8 mg/dL
BUN: 13 mg/dL
BUN: 9 mg/dL
Bilirubin,Total: 0.9 mg/dL
CALCIUM: 8.7 mg/dL — AB
CHLORIDE: 108 mmol/L
CO2: 23 mmol/L
CO2: 27 mmol/L
CREATININE: 0.57 mg/dL
Calcium, Total: 8.5 mg/dL — ABNORMAL LOW
Chloride: 105 mmol/L
Creatinine: 0.54 mg/dL
EGFR (African American): >60
EGFR (African American): >60
EGFR (Non-African Amer.): >60
EGFR (Non-African Amer.): >60
GLUCOSE: 124 mg/dL — AB
Glucose: 86 mg/dL
POTASSIUM: 3.6 mmol/L
Potassium: 3.5 mmol/L
SGOT(AST): 71 U/L — ABNORMAL HIGH
SGOT(AST): 91 U/L — ABNORMAL HIGH
SGPT (ALT): 97 U/L — ABNORMAL HIGH
SGPT (ALT): 124 U/L — ABNORMAL HIGH
Sodium: 138 mmol/L
Sodium: 137 mmol/L
Total Protein: 7.3 g/dL
Total Protein: 8.6 g/dL — ABNORMAL HIGH

## 2014-07-17 LAB — CBC
HCT: 40.9 % (ref 35.0–47.0)
HCT: 41.6 % (ref 35.0–47.0)
HGB: 13.6 g/dL (ref 12.0–16.0)
HGB: 14.4 g/dL (ref 12.0–16.0)
MCH: 30.1 pg (ref 26.0–34.0)
MCH: 31.3 pg (ref 26.0–34.0)
MCHC: 33.4 g/dL (ref 32.0–36.0)
MCHC: 34.5 g/dL (ref 32.0–36.0)
MCV: 90 fL (ref 80–100)
MCV: 91 fL (ref 80–100)
PLATELETS: 88 10*3/uL — AB (ref 150–440)
Platelet: 89 10*3/uL — ABNORMAL LOW (ref 150–440)
RBC: 4.54 10*6/uL (ref 3.80–5.20)
RBC: 4.59 10*6/uL (ref 3.80–5.20)
RDW: 12.1 % (ref 11.5–14.5)
RDW: 12.2 % (ref 11.5–14.5)
WBC: 4.6 10*3/uL (ref 3.6–11.0)
WBC: 5.9 10*3/uL (ref 3.6–11.0)

## 2014-07-17 LAB — PROTIME-INR
INR: 1
Prothrombin Time: 13.1 secs

## 2014-07-17 LAB — HCG, QUANTITATIVE, PREGNANCY: BETA HCG, QUANT.: 553 m[IU]/mL — AB

## 2014-07-17 LAB — APTT: Activated PTT: 28.7 secs (ref 23.6–35.9)

## 2014-07-17 LAB — FIBRINOGEN: FIBRINOGEN: 324 mg/dL (ref 210–470)

## 2014-07-18 ENCOUNTER — Ambulatory Visit
Admit: 2014-07-18 | Disposition: A | Discharge status: Home / Self Care | Payer: Self-pay | Attending: Obstetrics and Gynecology | Admitting: Obstetrics and Gynecology

## 2014-07-18 LAB — SGOT (AST)(ARMC): SGOT(AST): 93 U/L — ABNORMAL HIGH

## 2014-07-18 LAB — CBC
HCT: 43.9 % (ref 35.0–47.0)
HGB: 14.4 g/dL (ref 12.0–16.0)
MCH: 30.1 pg (ref 26.0–34.0)
MCHC: 32.7 g/dL (ref 32.0–36.0)
MCV: 92 fL (ref 80–100)
PLATELETS: 93 10*3/uL — AB (ref 150–440)
RBC: 4.77 10*6/uL (ref 3.80–5.20)
RDW: 12.5 % (ref 11.5–14.5)
WBC: 5.1 10*3/uL (ref 3.6–11.0)

## 2014-07-18 LAB — HCG, QUANTITATIVE, PREGNANCY: BETA HCG, QUANT.: 125 m[IU]/mL — AB

## 2014-07-18 LAB — ALT: SGPT (ALT): 134 U/L — ABNORMAL HIGH

## 2014-07-28 ENCOUNTER — Ambulatory Visit: Payer: BLUE CROSS/BLUE SHIELD | Admitting: Neurology

## 2014-07-31 ENCOUNTER — Telehealth: Payer: Self-pay | Admitting: Neurology

## 2014-07-31 ENCOUNTER — Encounter: Payer: Self-pay | Admitting: Neurology

## 2014-07-31 LAB — SURGICAL PATHOLOGY

## 2014-07-31 NOTE — Telephone Encounter (Signed)
Pt no showed 07/28/14 NP appt w/ Dr. Everlena CooperJaffe. No show letter + policy mailed to pt. Referring provider's office notified via EPIC referral note / Sherri S.

## 2014-08-05 ENCOUNTER — Encounter: Payer: Self-pay | Admitting: Internal Medicine

## 2014-08-06 NOTE — Op Note (Signed)
PATIENT NAME:  Gabriella SchaumannSTEVENS, Gabriella Dennis MR#:  [id number] DATE OF BIRTH:  11-20-1990  DATE OF PROCEDURE:  07/18/2014  PREOPERATIVE DIAGNOSES:  1.  Cervical ectopic pregnancy versus incomplete spontaneous abortion.  2.  Thrombocytopenia, unknown origin. 3.  Recurrent pregnancy loss.  POSTOPERATIVE DIAGNOSIS: Incomplete spontaneous abortion.   SURGEON: Christeen DouglasBethany Mikaila Grunert, MD  PROCEDURE: Suction, dilation and curettage.   ANESTHESIA: General.  ESTIMATED BLOOD LOSS: 25 mL.   URINE OUTPUT: 50 mL.  INTRAVENOUS FLUIDS: 1100 mL.   SPECIMENS:  1.  Products of conception.  2.  SNP microarray on products of conception.   OTHER FLUIDS USED:  1.  30 mL of 0.5 unit/mL of vasopressin.  2.  0.2 mg Methergine.  3.  No Toradol given; IV Tylenol given instead.   FINDINGS: A small, mobile normally placed uterus with an enlarged cervix that was not dilated and was slightly bluish in cast with a small amount of blood leaking from the external cervical os.   INDICATION FOR PROCEDURE: Gabriella Dennis is a 24 year old gravida 3, para 0-0-2-0 at an unknown estimated gestational age who was seen 4 times in urgent care in emergency settings for vaginal bleeding with a presumed incomplete spontaneous abortion. Serial ultrasound exams however noted a 2 cm gestational sac in the cervical canal that did not resolve over 5 days. The patient has no known risk factors for cervical ectopic pregnancy. However, the reading radiologist did feel that a cervical ectopic was the most likely diagnosis given the persistence of a formed gestational sac with no fetal pole at the internal cervical canal. For this reason, the patient was consented for dilation and curettage with the understanding that extra measures would be taken to avoid hemorrhage and that a hysterectomy is sometimes needed in cases of hemorrhage during surgical management as a cervical ectopic pregnancy. No methotrexate was considered as there was no fetal pole noted  and the patient's liver enzymes were mildly elevated.   DESCRIPTION OF PROCEDURE: The patient was taken to the operating room where she was identified by name and birthdate. She was placed on the operating table in the supine position and general anesthesia was induced without difficulty. She was then repositioned in a dorsal lithotomy position using candy cane stirrups. Her abdomen and vagina were prepped and draped in the usual sterile fashion. A formal timeout procedure was performed with all team members present and in agreement. Of note, preoperatively, the patient was given 100 mg of oral doxycycline and will get 200 mg of oral doxycycline in the PACU.   A weighted speculum was placed in the vagina, and the cervix was visualized. It was noted to be bulbous and slightly bluish in cast. However, bleeding was minimal and her uterus was small and mobile. Her anterior cervix was grasped with the tenaculum. Two stay sutures were placed at 3 and 9 o'clock in an attempt to control bleeding from the cervical branches of the uterine artery. Then 30 mL of dilute vasopressin was injected deep into the cervical stroma   The cervix was then serially dilated up to a 6 mm suction curettage with Hegar dilators being careful to observe bleeding and avoid excessive pressure. Bleeding was noted and clear fluid was noted to be draining from the cervical os. Suction curettage was then performed with 5 or 6 passes. Sharp curettage was used to confirm evacuation of all contents from the cervical canal. Of note, the fundus was noted to be about 7 cm from the external os; however,  the uterine cavity did not appear to be enlarged and there was no tissue evacuated from the uterine cavity. A final suction pass was taken, bleeding was noted and 0.2 mg of IM Methergine was given at this time. Fundal massage noted a firm fundus. The cervix was patulous at the close of this procedure. The stay sutures were removed and the tenaculum was  removed from the anterior cervix. Silver nitrate and pressure was used to assure hemostasis at these sites. Toradol was not given because of her low platelet count and concerns for bleeding.   All instruments were then removed from the vagina. At this time, the patient tolerated the procedure well and is stable in PACU. Because cervical ectopic pregnancy is diagnosed by hysterectomy pathology, at this time it does appear that the patient had an incomplete spontaneous abortion that was trapped in the intracervical canal and not cervical ectopic pregnancy; however, she will be monitored over several hours and given strict precautions to return with increased bleeding.  ____________________________ Cline Cools, MD beb:sb D: 07/18/2014 14:12:54 ET T: 07/18/2014 14:28:30 ET JOB#: [id number]  cc: Cline Cools, MD, <Dictator> Cline Cools MD ELECTRONICALLY SIGNED 07/21/2014 10:44

## 2014-08-07 ENCOUNTER — Encounter: Payer: Self-pay | Admitting: *Deleted

## 2014-09-07 ENCOUNTER — Ambulatory Visit
Admission: RE | Admit: 2014-09-07 | Discharge: 2014-09-07 | Disposition: A | Discharge status: Home / Self Care | Payer: BLUE CROSS/BLUE SHIELD | Source: Ambulatory Visit | Attending: Maternal & Fetal Medicine | Admitting: Maternal & Fetal Medicine

## 2014-09-07 DIAGNOSIS — N96 Recurrent pregnancy loss: Secondary | ICD-10-CM | POA: Diagnosis not present

## 2014-09-07 NOTE — Progress Notes (Signed)
MFM Consultation  Gabriella Dennis is 24 yo G3P0030 with h/o PCOs and irregular menses here for consultation due to recurrent pregnancy loss.  She reports the first loss at age 42--she did not know how far in pregnancy she was due to irregular menses and prolonged episodes of amenorrhea (she reports going a year without a menstrual period).  She began bleeding and passed products of conception spontaneously.  The second pregnancy occurred under similar circumstances.  Most recently, she reports bleeding for approximately 1 months (while using Nuvaring) and 2 months of amenorrhea after removal of the contraception device.  She had a positive pregnancy test and an ultrasound later demonstrated a cervical ectopic versus. Spontaneous abortion in progress.  Microarray demonstrated normal female.  She underwent dilation and curettage (after injection of vasopressin) at [facility].  She and her fiancee would like to conceive.  She also met with our genetic counselor today.  Please see that note for further details.    Past medical History 1. 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  She reports occasional episodes of low blood glucose testing in the past 2. Low neutrophil count on serial CBCx--she is scheduled to see hematology tomorrow 3. Depression (per record)  OB History  Gravida Para Term Preterm AB SAB TAB Ectopic Multiple Living  _0    # Outcome Date GA Lbr Len/2nd Weight Sex Delivery Anes PTL Lv  3 Current           2 SAB           1 SAB             First two losses were with previous partner Menses onset age 58 No abnormal pap smear  Past Surgical History  Procedure Laterality Date  . Wisdom tooth extraction     Dilation and curettage, 2016  History   Social History  . Marital Status: Single    Spouse Name: N/A  . Number of Children: N/A  . Years of Education: N/A   Occupational History   . Not on file.   Social History Main Topics  . Smoking status: Former Research scientist (life sciences)  . Smokeless tobacco: Never Used  . Alcohol Use: Yes     Comment: occasional  . Drug Use: No  . Sexual Activity: Yes   Other Topics Concern  . 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    Family History: cousin with spina bifida   89 yo with PCOS and irregular menses here for preconception counseling due to recurrent pregnancy loss.  We addressed primary etiologies of RPL including genetic conditions/aneuploiody, endocrine abnormalities (thyroid dysfunction, poorly controlled diabetes), structural uterine abnormalities (fibroids, uterine anomalies), and conditions such as antiphospholipid antibody syndrome.  I would recommend: 1. Evaluation of the uterine cavity (with SIS or HSG).  Her previous ultrasound (during last pregnancy) did not demonstrate any evidence of uterine myoma 2. Antiphospholipid antibody testing (ordered today but can be drawn tomorrow if she has additional labs during her hematology consulte) The gestational ages of her earlier losses were not known and could have exceeded [redacted] weeks  gestation.   3. TSH (also ordered today, but can be drawn tomorrow at the time of her hematology appointment) 4. Given her history of low blood glucose, I did not feel strongly that Hgb A1c was needed today. 5. The normal microarray places her risk for personal balanced translocation lower.   Preconception: She has a BMI>30 and we addressed some of the pregnancy related risks such as preeclampsia and gestational hypertension associated with elevated maternal bmi.  She previously read about risks for infertility and pregnancy loss.  We addressed increasing her activity level (she is sedentary presently) in order to improve her metabolism and reduce insulin resistance.  She may benefit from nutrition counseling, as well. I recommended she begin folic acid 53m daily She reports up to date vaccines (she has had chicken pox) because she needed them for entry to college.  Infertility We briefly addressed how she can determine if she's ovulated (eg basal body temperature) and the fact that temperature elevation correlates with ovulation but is not effective for timing intercourse in order to conceive.  I recommended she discuss options like use of medications to induce ovulation followed by timed intercourse.  She queried use of metformin, however this medication may further exacerbate hypoglycemia for her. We also discussed possible referral to reproductive endocrinology for their input.    Time spent in consultation: 30 minutes

## 2014-09-08 ENCOUNTER — Ambulatory Visit: Payer: BLUE CROSS/BLUE SHIELD | Admitting: Hematology and Oncology

## 2014-09-08 ENCOUNTER — Ambulatory Visit: Payer: BLUE CROSS/BLUE SHIELD

## 2014-09-11 NOTE — Addendum Note (Signed)
Encounter addended by: Katrina Stackeborah Oseph Imburgia on: 09/11/2014 12:52 PM<BR>     Documentation filed: Notes Section, LOS Section, Follow-up Section

## 2014-09-11 NOTE — Addendum Note (Signed)
Encounter addended by: Katrina Stackeborah Charie Pinkus on: 09/11/2014 12:43 PM<BR>     Documentation filed: Notes Section

## 2014-09-11 NOTE — Progress Notes (Signed)
Referring Physician:  [facility] Time of Consultation: 30 minutes  Gabriella Dennis was referred for an MFM consultation and genetic counseling regarding her history of recurrent pregnancy loss.  This letter is a summary of our visit and may be helpful as questions arise.  We first obtained a detailed family history and pregnancy history.  Ms. Guzek is not currently pregnant.  She has had 3 pregnancies.  The first two ended in early loss, with her first partner.  She was not certain of the gestational age due to irregular periods.  The most recent loss was also in the first trimester and was thought to be a cervical ectopic pregnancy.  Chromosomal microarray on that loss was normal female (with some likely female cells attributed to maternal cell contamination).   In the family history, Ms. Gonet reported one maternal first cousin with an open neural tube defect.  There are no other family members with birth defects, developmental differences, chromosome conditions, recurrent pregnancy loss or known genetic conditions.  We discussed general information about neural tube defects.  To review, neural tube defects (also called spina bifida) happen very early in pregnancy.  During development, the spinal column starts out as a flat sheet of cells.  At approximately 4 weeks after conception, the sheet of cells begins to curl and form a tube.  This tube begins to fuse and zip closed.  It remains unclear if the closing begins at one point or starts at several places along the length of the spine.  Once the column is closed, the skull bones begin to form, initiating brain and other neural element formation.  If there is failure in the fusion of the tube, then an opening results.  The higher the opening, the more neurological problems typically result.  An opening at the area of the skull results in anencephaly, which is not compatible with the baby surviving very long after birth.  A lower opening may result in  lower body weakness to paralysis.  Mental retardation may also occur due to excess fluid in the brain.  As we discussed, it is very difficult to provide specific information about the prognosis based on prenatal ultrasound.  Most often, neural tube defects occur as an isolated birth defect, however, some are the result of changes in genes or in the number or structure of the chromosomes.  In the absence of other birth defects or a known genetic syndrome as the cause,  Neural tube defects are due to multifactorial inheritance.  This means there is likely a combination of genetic and environmental factors involved.  Given the fact that the relative would be a fourth degree relative to any child of Ms. Gerilyn Nestle, the chance for a neural tube defect is not expected to be significantly higher than the general population risk of 1-2 per 1,000.  In any future pregnancy, maternal serum screening and level 2 ultrasound can be used to further assess the chance for an open neural tube defect.    There can be many different causes for pregnancy loss.  It is known that approximately 15% of recognized pregnancies will end in miscarriage, often with no known cause.  When a person has experienced more than three losses, we typically begin to search for specific factors causing the miscarriages. If a couple has experienced less than three losses, it is less likely that there will be a specific identifiable cause.  During our visit, we discussed several of these causes, including chromosome rearrangements, clotting factors, antibodies,  and structural differences in the uterus.  Dr. Diamantina Monks also met with Ms. Markwardt and offered testing as appropriate.  Maternal karyotype was discussed, but not performed at this time, particularly because the microarray results were normal in the most recent loss, which reduces the chance for a parental chromosomal translocation as the cause for the loss.  If all other testing is normal and any  additional losses occur, we could revisit a maternal chromosome analysis.    At the time of this visit, Ms. Persinger elected to proceed with thyroid studies, antiphospholipid testing.  The uterine SIS or HSG was also recommended by Dr. Diamantina Monks.   We are happy to speak with this family further to arrange for testing or answer questions.  We may be reached at [phone].  Wilburt Finlay, MS, CGC

## 2014-09-15 ENCOUNTER — Ambulatory Visit: Payer: BLUE CROSS/BLUE SHIELD | Admitting: Hematology and Oncology

## 2014-09-28 NOTE — Progress Notes (Signed)
Patient seen, agree with plans as outlined in genetic counselor's note.  

## 2014-10-03 ENCOUNTER — Inpatient Hospital Stay: Payer: BLUE CROSS/BLUE SHIELD | Attending: Hematology and Oncology | Admitting: Hematology and Oncology

## 2014-10-05 ENCOUNTER — Encounter: Payer: Self-pay | Admitting: *Deleted

## 2014-10-05 LAB — US OB COMP LESS 14 WKS

## 2015-05-12 ENCOUNTER — Encounter (HOSPITAL_COMMUNITY): Payer: Self-pay | Admitting: *Deleted
